# Patient Record
Sex: Female | Born: 1950 | Race: Black or African American | Hispanic: No | Smoking: Never smoker
Health system: Southern US, Community
[De-identification: ages and names within clinical notes are randomized; demographics above are authoritative.]

## PROBLEM LIST (undated history)

## (undated) DIAGNOSIS — M5126 Other intervertebral disc displacement, lumbar region: Secondary | ICD-10-CM

## (undated) DIAGNOSIS — M51369 Other intervertebral disc degeneration, lumbar region without mention of lumbar back pain or lower extremity pain: Secondary | ICD-10-CM

## (undated) DIAGNOSIS — E669 Obesity, unspecified: Secondary | ICD-10-CM

## (undated) DIAGNOSIS — M5136 Other intervertebral disc degeneration, lumbar region: Secondary | ICD-10-CM

## (undated) DIAGNOSIS — M48061 Spinal stenosis, lumbar region without neurogenic claudication: Secondary | ICD-10-CM

## (undated) DIAGNOSIS — I272 Pulmonary hypertension, unspecified: Secondary | ICD-10-CM

## (undated) DIAGNOSIS — K2970 Gastritis, unspecified, without bleeding: Secondary | ICD-10-CM

## (undated) DIAGNOSIS — K6289 Other specified diseases of anus and rectum: Secondary | ICD-10-CM

## (undated) DIAGNOSIS — M1990 Unspecified osteoarthritis, unspecified site: Secondary | ICD-10-CM

## (undated) DIAGNOSIS — I10 Essential (primary) hypertension: Secondary | ICD-10-CM

## (undated) DIAGNOSIS — K219 Gastro-esophageal reflux disease without esophagitis: Secondary | ICD-10-CM

## (undated) DIAGNOSIS — K439 Ventral hernia without obstruction or gangrene: Secondary | ICD-10-CM

## (undated) HISTORY — DX: Unspecified osteoarthritis, unspecified site: M19.90

## (undated) HISTORY — PX: ABDOMINAL HYSTERECTOMY: SHX81

## (undated) HISTORY — DX: Spinal stenosis, lumbar region without neurogenic claudication: M48.061

## (undated) HISTORY — DX: Essential (primary) hypertension: I10

## (undated) HISTORY — DX: Ventral hernia without obstruction or gangrene: K43.9

## (undated) HISTORY — DX: Pulmonary hypertension, unspecified: I27.20

## (undated) HISTORY — DX: Gastritis, unspecified, without bleeding: K29.70

## (undated) HISTORY — DX: Other specified diseases of anus and rectum: K62.89

## (undated) HISTORY — PX: CARPAL TUNNEL RELEASE: SHX101

## (undated) HISTORY — DX: Gastro-esophageal reflux disease without esophagitis: K21.9

## (undated) HISTORY — PX: WRIST SURGERY: SHX841

## (undated) HISTORY — DX: Obesity, unspecified: E66.9

## (undated) HISTORY — PX: TUBAL LIGATION: SHX77

## (undated) HISTORY — DX: Other intervertebral disc displacement, lumbar region: M51.26

## (undated) HISTORY — DX: Other intervertebral disc degeneration, lumbar region without mention of lumbar back pain or lower extremity pain: M51.369

## (undated) HISTORY — DX: Other intervertebral disc degeneration, lumbar region: M51.36

---

## 2000-02-18 ENCOUNTER — Encounter: Payer: Self-pay | Admitting: Family Medicine

## 2000-02-18 ENCOUNTER — Encounter: Admission: RE | Admit: 2000-02-18 | Discharge: 2000-02-18 | Payer: Self-pay | Admitting: Family Medicine

## 2000-04-08 ENCOUNTER — Encounter: Admission: RE | Admit: 2000-04-08 | Discharge: 2000-04-08 | Payer: Self-pay | Admitting: Obstetrics and Gynecology

## 2000-04-08 ENCOUNTER — Encounter: Payer: Self-pay | Admitting: Obstetrics and Gynecology

## 2001-10-10 ENCOUNTER — Encounter: Admission: RE | Admit: 2001-10-10 | Discharge: 2001-11-13 | Payer: Self-pay | Admitting: Family Medicine

## 2004-06-02 ENCOUNTER — Encounter: Payer: Self-pay | Admitting: Internal Medicine

## 2004-10-20 ENCOUNTER — Ambulatory Visit: Payer: Self-pay | Admitting: Family Medicine

## 2004-10-28 ENCOUNTER — Encounter: Admission: RE | Admit: 2004-10-28 | Discharge: 2004-10-28 | Payer: Self-pay | Admitting: Family Medicine

## 2005-05-03 ENCOUNTER — Ambulatory Visit: Payer: Self-pay | Admitting: Family Medicine

## 2005-07-02 ENCOUNTER — Ambulatory Visit: Payer: Self-pay | Admitting: Family Medicine

## 2005-07-23 ENCOUNTER — Ambulatory Visit: Payer: Self-pay | Admitting: Family Medicine

## 2005-09-27 ENCOUNTER — Ambulatory Visit: Payer: Self-pay | Admitting: Family Medicine

## 2005-12-20 ENCOUNTER — Other Ambulatory Visit: Admission: RE | Admit: 2005-12-20 | Discharge: 2005-12-20 | Payer: Self-pay | Admitting: Obstetrics and Gynecology

## 2006-02-11 ENCOUNTER — Encounter: Admission: RE | Admit: 2006-02-11 | Discharge: 2006-02-11 | Payer: Self-pay | Admitting: Family Medicine

## 2006-02-11 ENCOUNTER — Ambulatory Visit: Payer: Self-pay | Admitting: Family Medicine

## 2006-05-09 ENCOUNTER — Ambulatory Visit: Payer: Self-pay | Admitting: Family Medicine

## 2006-05-12 ENCOUNTER — Ambulatory Visit: Payer: Self-pay | Admitting: Family Medicine

## 2006-05-19 ENCOUNTER — Ambulatory Visit: Payer: Self-pay | Admitting: Family Medicine

## 2007-01-25 ENCOUNTER — Ambulatory Visit: Payer: Self-pay | Admitting: Family Medicine

## 2007-03-01 ENCOUNTER — Encounter: Admission: RE | Admit: 2007-03-01 | Discharge: 2007-03-01 | Payer: Self-pay | Admitting: Family Medicine

## 2007-05-25 ENCOUNTER — Ambulatory Visit: Payer: Self-pay | Admitting: Family Medicine

## 2007-05-25 DIAGNOSIS — B369 Superficial mycosis, unspecified: Secondary | ICD-10-CM | POA: Insufficient documentation

## 2007-05-25 DIAGNOSIS — I10 Essential (primary) hypertension: Secondary | ICD-10-CM | POA: Insufficient documentation

## 2007-05-25 LAB — CONVERTED CEMR LAB
ALT: 37 units/L — ABNORMAL HIGH (ref 0–35)
AST: 28 units/L (ref 0–37)
Basophils Relative: 0.5 % (ref 0.0–1.0)
Bilirubin, Direct: 0.1 mg/dL (ref 0.0–0.3)
CO2: 32 meq/L (ref 19–32)
Calcium: 9.2 mg/dL (ref 8.4–10.5)
Chloride: 103 meq/L (ref 96–112)
Eosinophils Relative: 2.4 % (ref 0.0–5.0)
GFR calc Af Amer: 67 mL/min
Glucose, Bld: 86 mg/dL (ref 70–99)
HCT: 43.3 % (ref 36.0–46.0)
Lymphocytes Relative: 36.5 % (ref 12.0–46.0)
Neutro Abs: 2.3 10*3/uL (ref 1.4–7.7)
Platelets: 272 10*3/uL (ref 150–400)
Total Protein: 7 g/dL (ref 6.0–8.3)
Triglycerides: 116 mg/dL (ref 0–149)
VLDL: 23 mg/dL (ref 0–40)
WBC: 4.2 10*3/uL — ABNORMAL LOW (ref 4.5–10.5)

## 2007-07-17 ENCOUNTER — Ambulatory Visit: Payer: Self-pay | Admitting: Family Medicine

## 2007-07-17 DIAGNOSIS — E669 Obesity, unspecified: Secondary | ICD-10-CM | POA: Insufficient documentation

## 2007-10-16 ENCOUNTER — Ambulatory Visit: Payer: Self-pay | Admitting: Family Medicine

## 2007-10-16 DIAGNOSIS — J029 Acute pharyngitis, unspecified: Secondary | ICD-10-CM | POA: Insufficient documentation

## 2008-03-04 ENCOUNTER — Encounter: Admission: RE | Admit: 2008-03-04 | Discharge: 2008-03-04 | Payer: Self-pay | Admitting: Family Medicine

## 2008-04-30 ENCOUNTER — Ambulatory Visit: Payer: Self-pay | Admitting: Family Medicine

## 2008-04-30 DIAGNOSIS — K219 Gastro-esophageal reflux disease without esophagitis: Secondary | ICD-10-CM | POA: Insufficient documentation

## 2008-06-11 ENCOUNTER — Telehealth: Payer: Self-pay | Admitting: Internal Medicine

## 2008-06-11 ENCOUNTER — Ambulatory Visit: Payer: Self-pay | Admitting: Family Medicine

## 2008-06-12 ENCOUNTER — Ambulatory Visit: Payer: Self-pay | Admitting: Internal Medicine

## 2008-06-12 DIAGNOSIS — R12 Heartburn: Secondary | ICD-10-CM | POA: Insufficient documentation

## 2008-06-12 DIAGNOSIS — R079 Chest pain, unspecified: Secondary | ICD-10-CM | POA: Insufficient documentation

## 2008-06-26 ENCOUNTER — Ambulatory Visit: Payer: Self-pay | Admitting: Internal Medicine

## 2008-07-03 ENCOUNTER — Encounter: Payer: Self-pay | Admitting: Internal Medicine

## 2008-07-03 ENCOUNTER — Ambulatory Visit: Payer: Self-pay | Admitting: Internal Medicine

## 2008-07-04 ENCOUNTER — Encounter: Payer: Self-pay | Admitting: Internal Medicine

## 2008-07-15 ENCOUNTER — Ambulatory Visit: Payer: Self-pay | Admitting: Family Medicine

## 2008-07-15 LAB — CONVERTED CEMR LAB
AST: 24 units/L (ref 0–37)
Albumin: 3.8 g/dL (ref 3.5–5.2)
Alkaline Phosphatase: 38 units/L — ABNORMAL LOW (ref 39–117)
BUN: 22 mg/dL (ref 6–23)
Bilirubin, Direct: 0.1 mg/dL (ref 0.0–0.3)
Blood in Urine, dipstick: NEGATIVE
Chloride: 107 meq/L (ref 96–112)
Eosinophils Absolute: 0.1 10*3/uL (ref 0.0–0.7)
Eosinophils Relative: 2.6 % (ref 0.0–5.0)
GFR calc non Af Amer: 50 mL/min
HDL: 28.1 mg/dL — ABNORMAL LOW (ref >39.0)
MCV: 88 fL (ref 78.0–100.0)
Monocytes Relative: 6.3 % (ref 3.0–12.0)
Neutrophils Relative %: 49.5 % (ref 43.0–77.0)
Nitrite: NEGATIVE
Platelets: 198 10*3/uL (ref 150–400)
Potassium: 3.4 meq/L — ABNORMAL LOW (ref 3.5–5.1)
Protein, U semiquant: NEGATIVE
Sodium: 146 meq/L — ABNORMAL HIGH (ref 135–145)
Total CHOL/HDL Ratio: 5.4
Urobilinogen, UA: 1
VLDL: 22 mg/dL (ref 0–40)
WBC Urine, dipstick: NEGATIVE
WBC: 4.2 10*3/uL — ABNORMAL LOW (ref 4.5–10.5)

## 2008-08-08 ENCOUNTER — Ambulatory Visit (HOSPITAL_BASED_OUTPATIENT_CLINIC_OR_DEPARTMENT_OTHER): Admission: RE | Admit: 2008-08-08 | Discharge: 2008-08-08 | Payer: Self-pay | Admitting: Orthopedic Surgery

## 2009-05-22 ENCOUNTER — Ambulatory Visit: Payer: Self-pay | Admitting: Family Medicine

## 2009-06-25 ENCOUNTER — Ambulatory Visit: Payer: Self-pay | Admitting: Internal Medicine

## 2009-07-30 ENCOUNTER — Encounter: Admission: RE | Admit: 2009-07-30 | Discharge: 2009-07-30 | Payer: Self-pay | Admitting: Family Medicine

## 2009-11-10 ENCOUNTER — Ambulatory Visit: Payer: Self-pay | Admitting: Internal Medicine

## 2010-05-29 ENCOUNTER — Ambulatory Visit: Payer: Self-pay | Admitting: Internal Medicine

## 2010-05-29 DIAGNOSIS — R1013 Epigastric pain: Secondary | ICD-10-CM

## 2010-05-29 DIAGNOSIS — K3189 Other diseases of stomach and duodenum: Secondary | ICD-10-CM | POA: Insufficient documentation

## 2010-05-29 DIAGNOSIS — K439 Ventral hernia without obstruction or gangrene: Secondary | ICD-10-CM | POA: Insufficient documentation

## 2010-06-02 ENCOUNTER — Ambulatory Visit (HOSPITAL_COMMUNITY)
Admission: RE | Admit: 2010-06-02 | Discharge: 2010-06-02 | Payer: Self-pay | Source: Home / Self Care | Admitting: Internal Medicine

## 2010-08-10 ENCOUNTER — Ambulatory Visit: Payer: Self-pay | Admitting: Family Medicine

## 2010-08-10 DIAGNOSIS — M898X9 Other specified disorders of bone, unspecified site: Secondary | ICD-10-CM | POA: Insufficient documentation

## 2010-08-11 ENCOUNTER — Encounter: Admission: RE | Admit: 2010-08-11 | Discharge: 2010-08-11 | Payer: Self-pay | Admitting: Family Medicine

## 2010-10-01 ENCOUNTER — Other Ambulatory Visit: Payer: Self-pay | Admitting: Family Medicine

## 2010-10-01 ENCOUNTER — Ambulatory Visit
Admission: RE | Admit: 2010-10-01 | Discharge: 2010-10-01 | Payer: Self-pay | Source: Home / Self Care | Attending: Family Medicine | Admitting: Family Medicine

## 2010-10-01 LAB — CBC WITH DIFFERENTIAL/PLATELET
Basophils Absolute: 0 10*3/uL (ref 0.0–0.1)
Basophils Relative: 0.4 % (ref 0.0–3.0)
Eosinophils Absolute: 0.1 10*3/uL (ref 0.0–0.7)
Eosinophils Relative: 2.6 % (ref 0.0–5.0)
HCT: 41 % (ref 36.0–46.0)
Hemoglobin: 14.2 g/dL (ref 12.0–15.0)
Lymphocytes Relative: 32.2 % (ref 12.0–46.0)
Lymphs Abs: 1.3 10*3/uL (ref 0.7–4.0)
MCHC: 34.6 g/dL (ref 30.0–36.0)
MCV: 87.7 fl (ref 78.0–100.0)
Monocytes Absolute: 0.3 10*3/uL (ref 0.1–1.0)
Monocytes Relative: 7.7 % (ref 3.0–12.0)
Neutro Abs: 2.2 10*3/uL (ref 1.4–7.7)
Neutrophils Relative %: 57.1 % (ref 43.0–77.0)
Platelets: 191 10*3/uL (ref 150.0–400.0)
RBC: 4.68 Mil/uL (ref 3.87–5.11)
RDW: 13.7 % (ref 11.5–14.6)
WBC: 3.9 10*3/uL — ABNORMAL LOW (ref 4.5–10.5)

## 2010-10-01 LAB — BASIC METABOLIC PANEL
BUN: 18 mg/dL (ref 6–23)
CO2: 25 mEq/L (ref 19–32)
Calcium: 8.3 mg/dL — ABNORMAL LOW (ref 8.4–10.5)
Chloride: 103 mEq/L (ref 96–112)
Creatinine, Ser: 1 mg/dL (ref 0.4–1.2)
GFR: 77.75 mL/min (ref >60.00)
Glucose, Bld: 91 mg/dL (ref 70–99)
Potassium: 3.9 mEq/L (ref 3.5–5.1)
Sodium: 135 mEq/L (ref 135–145)

## 2010-10-01 LAB — LIPID PANEL
Cholesterol: 136 mg/dL (ref 0–200)
HDL: 30 mg/dL — ABNORMAL LOW (ref >39.00)
LDL Cholesterol: 91 mg/dL (ref 0–99)
Total CHOL/HDL Ratio: 5
Triglycerides: 73 mg/dL (ref 0.0–149.0)
VLDL: 14.6 mg/dL (ref 0.0–40.0)

## 2010-10-01 LAB — HEPATIC FUNCTION PANEL
ALT: 24 U/L (ref 0–35)
AST: 21 U/L (ref 0–37)
Albumin: 3.7 g/dL (ref 3.5–5.2)
Alkaline Phosphatase: 57 U/L (ref 39–117)
Bilirubin, Direct: 0.1 mg/dL (ref 0.0–0.3)
Total Bilirubin: 0.9 mg/dL (ref 0.3–1.2)
Total Protein: 6.3 g/dL (ref 6.0–8.3)

## 2010-10-01 LAB — CONVERTED CEMR LAB
Blood in Urine, dipstick: NEGATIVE
Ketones, urine, test strip: NEGATIVE
Nitrite: NEGATIVE
Protein, U semiquant: NEGATIVE
Urobilinogen, UA: 0.2

## 2010-10-01 LAB — TSH: TSH: 1.63 u[IU]/mL (ref 0.35–5.50)

## 2010-10-07 ENCOUNTER — Encounter: Payer: Self-pay | Admitting: Family Medicine

## 2010-10-07 ENCOUNTER — Ambulatory Visit
Admission: RE | Admit: 2010-10-07 | Discharge: 2010-10-07 | Payer: Self-pay | Source: Home / Self Care | Attending: Family Medicine | Admitting: Family Medicine

## 2010-10-20 NOTE — Assessment & Plan Note (Signed)
Summary: acid reflux--ch.   History of Present Illness Primary GI MD: Lina Sar MD Primary Provider: Kelle Darting, MD Requesting Provider: n/a Chief Complaint: Increasing acid reflux symptoms and esophageal pain with some upper abd discomfort. Pt is on Nexium qd.  History of Present Illness:   This is a 60 year old African American female with gastroesophageal reflux diagnosed in October 2009 on an upper endoscopy which showed a gastric inlet patch in the proximal esophagus. There was no evidence of Barrett's esophagus and her H. pylori was negative. She comes with recurrent symptoms of gastroesophageal reflux manifested by recurrent heartburn. She had an episode of epigastric swelling and contractions which felt like jumping. She denies dysphagia. She has been on Nexium 40 mg every morning since her last appointment in February 2011. She takes occasional Aleve and drinks 2-3 cans of soda a day. She does not smoke. She has been overweight. A colonoscopy in 2005 was normal.   GI Review of Systems    Reports abdominal pain, acid reflux, belching, and  bloating.     Location of  Abdominal pain: upper abdomen.    Denies chest pain, dysphagia with liquids, dysphagia with solids, heartburn, loss of appetite, nausea, vomiting, vomiting blood, weight loss, and  weight gain.        Denies anal fissure, black tarry stools, change in bowel habit, constipation, diarrhea, diverticulosis, fecal incontinence, heme positive stool, hemorrhoids, irritable bowel syndrome, jaundice, light color stool, liver problems, rectal bleeding, and  rectal pain.    Current Medications (verified): 1)  Tenormin 50 Mg Tabs (Atenolol) .... Take 1 Tablet By Mouth Every Morning 2)  Nexium 40 Mg Cpdr (Esomeprazole Magnesium) .... Take 1 Tablet By Mouth Once A Day  Allergies (verified): No Known Drug Allergies  Past History:  Past Medical History: Reviewed history from 06/19/2008 and no changes required. degenerative  joint disease Arthritis HEARTBURN (ICD-787.1) CHEST PAIN (ICD-786.50) GERD (ICD-530.81) SORE THROAT (ICD-462) OBESITY NOS (ICD-278.00) HEALTH SCREENING (ICD-V70.0) DERMATOMYCOSIS NOS (ICD-111.9) HYPERTENSION (ICD-401.9)  Past Surgical History: Reviewed history from 06/19/2008 and no changes required. Hysterectomy Bilateral Tubal Ligation Cyst removal from right wrist  Family History: Reviewed history from 06/12/2008 and no changes required. Family History of Breast Cancer:cousin Family History of Colon Cancer:uncle Family History of Colon Polyps:Uncle Family History of Diabetes: Aunt Family History of Heart Disease: Mother Family History of Kidney Disease:Uncle  Social History: Reviewed history from 07/17/2007 and no changes required. 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  Review of Systems  The patient denies allergy/sinus, anemia, anxiety-new, arthritis/joint pain, back pain, blood in urine, breast changes/lumps, change in vision, confusion, cough, coughing up blood, depression-new, fainting, fatigue, fever, headaches-new, hearing problems, heart murmur, heart rhythm changes, itching, menstrual pain, muscle pains/cramps, night sweats, nosebleeds, pregnancy symptoms, shortness of breath, skin rash, sleeping problems, sore throat, swelling of feet/legs, swollen lymph glands, thirst - excessive , urination - excessive , urination changes/pain, urine leakage, vision changes, and voice change.         Pertinent positive and negative review of systems were noted in the above HPI. All other ROS was otherwise negative.   Vital Signs:  Patient profile:   60 year old female Height:      72 inches Weight:      299 pounds BMI:     40.70 BSA:     2.53 Pulse rate:   70 / minute Pulse rhythm:   regular BP sitting:   118 /  68  (right arm) Cuff size:   large  Vitals Entered By: Christie Nottingham CMA Duncan Dull)  (May 29, 2010 8:12 AM)  Physical Exam  General:  alert, oriented and in no distress, her voice was normal. Eyes:  nonicteric. Mouth:  no thrush. Neck:  Supple; no masses or thyromegaly. Lungs:  Clear throughout to auscultation. Heart:  Regular rate and rhythm; no murmurs, rubs,  or bruits. Abdomen:  soft obese abdomen with minimal tenderness in the epigastrium in the subxiphoid area. Ventral hernia due to rectus diathesis easily reducible. Liver edge at costal margin, lower abdomen unremarkable. Extremities:  No clubbing, cyanosis, edema or deformities noted. Skin:  Intact without significant lesions or rashes. Psych:  Alert and cooperative. Normal mood and affect.   Impression & Recommendations:  Problem # 1:  GERD (ICD-530.81) Patient has refractory gastroesophageal reflux. We will increase her Nexium to 40 mg twice a day. If not approved by her insurance, she will substitute over-the-counter Prilosec 20 mg in the evening while continuing her Nexium 40 mg in the morning. She should reduce her caffeine intake and attempt weight loss.  Problem # 2:  VENTRAL HERNIA (ICD-553.20) Patient has a small ventral hernia causing intermittent bulging in the upper abdomen. She does not need surgical intervention at this time. I explained to the patient what is happening. We will proceed with an upper abdominal ultrasound to rule out gallbladder disease as part of the evaluation of her dyspepsia.  Other Orders: Ultrasound Abdomen (UAS)  Patient Instructions: 1)  increase Nexium to 40 mg p.o. b.i.d. for 2 weeks then decrease to one a day. 2)  As an alternative, add Prilosec 20 mg at bedtime while continuing Nexium 40 mg in a.m. 3)  If symptoms persist, consider a repeat upper endoscopy. 4)  Upper abdominal ultrasound. 5)  Recall colonoscopy 2015. 6)  Copy sent to : Dr Shela Commons.Todd 7)  The medication list was reviewed and reconciled.  All changed / newly prescribed medications were explained.  A  complete medication list was provided to the patient / caregiver. Prescriptions: NEXIUM 40 MG CPDR (ESOMEPRAZOLE MAGNESIUM) Take 1 tablet by mouth two times a day  #60 x 2   Entered by:   Lamona Curl CMA (AAMA)   Authorized by:   Hart Carwin MD   Signed by:   Lamona Curl CMA (AAMA) on 05/29/2010   Method used:   Electronically to        [facility]  [address] # [id number]* (retail)       [address]       [place], [place]  [address]       Ph: [phone] or [phone]       Fax: [phone]   RxID:   [id number]

## 2010-10-20 NOTE — Assessment & Plan Note (Signed)
Summary: knot on foot//njr   Vital Signs:  Patient profile:   60 year old female Weight:      300 pounds Temp:     98.5 degrees F oral BP sitting:   140 / 90  (left arm) Cuff size:   regular  Vitals Entered By: Kern Reap CMA Duncan Dull) (August 10, 2010 2:49 PM) CC: knot on left foot   Primary Care Provider:  Kelle Darting, MD  CC:  knot on left foot.  History of Present Illness: Kiara Werner  is a 60 year old female, who comes in today for evaluation of a knot on the dorsal part of her left foot.  States about a week or 10 days ago she noticed a knot on the dorsum of her left foot.  No history of trauma  Allergies: No Known Drug Allergies  Past History:  Past medical, surgical, family and social histories (including risk factors) reviewed for relevance to current acute and chronic problems.  Past Medical History: Reviewed history from 06/19/2008 and no changes required. degenerative joint disease Arthritis HEARTBURN (ICD-787.1) CHEST PAIN (ICD-786.50) GERD (ICD-530.81) SORE THROAT (ICD-462) OBESITY NOS (ICD-278.00) HEALTH SCREENING (ICD-V70.0) DERMATOMYCOSIS NOS (ICD-111.9) HYPERTENSION (ICD-401.9)  Past Surgical History: Reviewed history from 06/19/2008 and no changes required. Hysterectomy Bilateral Tubal Ligation Cyst removal from right wrist  Family History: Reviewed history from 06/12/2008 and no changes required. Family History of Breast Cancer:cousin Family History of Colon Cancer:uncle Family History of Colon Polyps:Uncle Family History of Diabetes: Aunt Family History of Heart Disease: Mother Family History of Kidney Disease:Uncle  Social History: Reviewed history from 07/17/2007 and no changes required. 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 Take 1 tablet by mouth every morning 2)  Nexium 40 Mg Cpdr (Esomeprazole magnesium) .... Take 1 tablet by mouth two times a day  Patient Instructions: 1)  call [facility].......... asked to see Dr. Toni Arthurs, the orthopedist, who specializes in feet . 2)  Return in January for your annual physical exam........Marland Kitchen401.9 3)  BMP prior to visit, ICD-9: 4)  Hepatic Panel prior to visit, ICD-9: 5)  Lipid Panel prior to visit, ICD-9: 6)  TSH prior to visit, ICD-9: 7)  CBC w/ Diff prior to visit, ICD-9: 8)  Urine-dip prior to visit, ICD-9:   Orders Added: 1)  Est. Patient Level III [[address]]

## 2010-10-20 NOTE — Assessment & Plan Note (Signed)
Summary: acid reflux/chest pain...as.   History of Present Illness Visit Type: Follow-up Visit Primary GI MD: Lina Sar MD Primary Provider: Kelle Darting, MD Requesting Provider: Kelle Darting, MD Chief Complaint: acid reflux , phlegm in throat, and nausea with certain foods History of Present Illness:   This is a 60 year old African American female with chronic gastroesophageal reflux and intermittent chest pain. An upper endoscopy in October 2009 showed a small esophageal inlet patch but there was no evidence esophagitis. Her CLOtest was negative. Her last colonoscopy in 2005 was normal and her recall colonoscopy is scheduled for September 2015. She takes occasional TUMS or Rolaids but no acid reducers. She denies dysphagia, nocturnal cough or dysphagia. Patient does not smoke and does not use any NSAIDs.   GI Review of Systems    Reports acid reflux, belching, and  nausea.      Denies abdominal pain, chest pain, dysphagia with liquids, dysphagia with solids, heartburn, loss of appetite, vomiting, vomiting blood, weight loss, and  weight gain.      Reports constipation.     Denies anal fissure, black tarry stools, change in bowel habit, diarrhea, diverticulosis, fecal incontinence, heme positive stool, hemorrhoids, irritable bowel syndrome, jaundice, light color stool, liver problems, rectal bleeding, and  rectal pain.    Current Medications (verified): 1)  Tenormin 50 Mg Tabs (Atenolol) .... Take 1 Tablet By Mouth Every Morning 2)  Cipro 250 Mg Tabs (Ciprofloxacin Hcl) .Marland Kitchen.. 1 Tablet By Mouth Two Times A Day  Allergies (verified): No Known Drug Allergies  Past History:  Past Medical History: Reviewed history from 06/19/2008 and no changes required. degenerative joint disease Arthritis HEARTBURN (ICD-787.1) CHEST PAIN (ICD-786.50) GERD (ICD-530.81) SORE THROAT (ICD-462) OBESITY NOS (ICD-278.00) HEALTH SCREENING (ICD-V70.0) DERMATOMYCOSIS NOS (ICD-111.9) HYPERTENSION  (ICD-401.9)  Past Surgical History: Reviewed history from 06/19/2008 and no changes required. Hysterectomy Bilateral Tubal Ligation Cyst removal from right wrist  Family History: Reviewed history from 06/12/2008 and no changes required. Family History of Breast Cancer:cousin Family History of Colon Cancer:uncle Family History of Colon Polyps:Uncle Family History of Diabetes: Aunt Family History of Heart Disease: Mother Family History of Kidney Disease:Uncle  Social History: Reviewed history from 07/17/2007 and no changes required. 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  The patient denies allergy/sinus, anemia, anxiety-new, arthritis/joint pain, back pain, blood in urine, breast changes/lumps, change in vision, confusion, cough, coughing up blood, depression-new, fainting, fatigue, fever, headaches-new, hearing problems, heart murmur, heart rhythm changes, itching, menstrual pain, muscle pains/cramps, night sweats, nosebleeds, pregnancy symptoms, shortness of breath, skin rash, sleeping problems, sore throat, swelling of feet/legs, swollen lymph glands, thirst - excessive , urination - excessive , urine leakage, vision changes, and voice change.         Pertinent positive and negative review of systems were noted in the above HPI. All other ROS was otherwise negative.   Vital Signs:  Patient profile:   60 year old female Height:      72 inches Weight:      296 pounds BMI:     40.29 Pulse rate:   68 / minute Pulse rhythm:   regular BP sitting:   124 / 74  (left arm)  Vitals Entered By: Milford Cage NCMA (November 10, 2009 3:49 PM)  Physical Exam  General:  overweight, alert and oriented. Mouth:  normal boys Neck:  Supple; no  masses or thyromegaly. Lungs:  Clear throughout to auscultation. Heart:  Regular rate and rhythm; no  murmurs, rubs,  or bruits. Abdomen:  Soft, nontender and nondistended. No masses, hepatosplenomegaly or hernias noted. Normal bowel sounds. Skin:  Intact without significant lesions or rashes. Psych:  Alert and cooperative. Normal mood and affect.   Impression & Recommendations:  Problem # 1:  CHEST PAIN (ICD-786.50) Patient has known gastroesophageal reflux. A recent upper endoscopy did not show any evidence of Barrett's esophagus. We will start patient on a proton pump inhibitor; Nexium 40 mg a day. In the meantime, we have given her samples of AcipHex since he did not have any samples of Nexium in our office. She will have to continue antireflux measures.  Problem # 2:  OBESITY NOS (ICD-278.00) I suggested weight reduction to improve her gastroesophageal reflux disease.  Problem # 3:  SPECIAL SCREENING FOR MALIGNANT NEOPLASMS COLON (ICD-V76.51) status post screening colonoscopy in 2005. A recall colonoscopy will be due in September 2015.  Patient Instructions: 1)  Nexium 40 mg daily. 2)  Antireflux measures. 3)  Weight loss. 4)  Recall colonoscopy September 2015. 5)  Copy sent to : Dr Shela Commons.Todd 6)  The medication list was reviewed and reconciled.  All changed / newly prescribed medications were explained.  A complete medication list was provided to the patient / caregiver. Prescriptions: NEXIUM 40 MG CPDR (ESOMEPRAZOLE MAGNESIUM) Take 1 tablet by mouth once a day  #30 x 5   Entered by:   Hortense Ramal CMA (AAMA)   Authorized by:   Hart Carwin MD   Signed by:   Hortense Ramal CMA (AAMA) on 11/10/2009   Method used:   Electronically to        [facility] # [id number]* (retail)       [address]       [place], [place]  [address]       Ph: [phone] or [phone]       Fax: [phone]   RxID:   [id number]

## 2010-10-22 NOTE — Assessment & Plan Note (Signed)
Summary: cpx/pap/njr   Vital Signs:  Patient profile:   60 year old female Menstrual status:  hysterectomy Height:      72 inches Weight:      300 pounds Temp:     98.3 degrees F oral BP sitting:   118 / 80  (left arm) Cuff size:   regular  Vitals Entered By: Kern Reap CMA Duncan Dull) (October 07, 2010 9:15 AM) CC: cpx     Menstrual Status hysterectomy   Primary Care Provider:  Kelle Darting, MD  CC:  cpx.  History of Present Illness: Kiara Werner is a 60 year old single female, nonsmoker, who comes in today for general medical examination because of a history of underlying hypertension, reflux, esophagitis, and obesity.  She is on Tenormin 50 mg daily BP 118/80.  She takes Nexium 40 mg daily, sometimes b.i.d. for reflux esophagitis.  She's tried the over-the-counter omeprazole, but it does not work.  She was instructed by GI to take the Nexium daily.  Her weight is 300 pounds.  She, states she's walking an hour 3 times a week and has lost 3 pounds since Christmas.  She gets routine eye care, dental care, BSE monthly, and you mammography, colonoscopy, normal 2005, tetanus, 2000, seasonal flu 2011  Allergies (verified): No Known Drug Allergies  Past History:  Past medical, surgical, family and social histories (including risk factors) reviewed, and no changes noted (except as noted below).  Past Medical History: Reviewed history from 06/19/2008 and no changes required. degenerative joint disease Arthritis HEARTBURN (ICD-787.1) CHEST PAIN (ICD-786.50) GERD (ICD-530.81) SORE THROAT (ICD-462) OBESITY NOS (ICD-278.00) HEALTH SCREENING (ICD-V70.0) DERMATOMYCOSIS NOS (ICD-111.9) HYPERTENSION (ICD-401.9)  Past Surgical History: Reviewed history from 06/19/2008 and no changes required. Hysterectomy Bilateral Tubal Ligation Cyst removal from right wrist  Family History: Reviewed history from 06/12/2008 and no changes required. Family History of Breast  Cancer:cousin Family History of Colon Cancer:uncle Family History of Colon Polyps:Uncle Family History of Diabetes: Aunt Family History of Heart Disease: Mother Family History of Kidney Disease:Uncle  Social History: Reviewed history from 07/17/2007 and no changes required. 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 No apparent alopecia or balding. Eyes:  No corneal or conjunctival inflammation noted. EOMI. Perrla. Funduscopic exam benign, without hemorrhages, exudates or papilledema. Vision grossly normal. Ears:  External ear exam shows no significant lesions or deformities.  Otoscopic examination reveals clear canals, tympanic membranes are intact bilaterally without bulging, retraction, inflammation or discharge. Hearing is grossly normal bilaterally. Nose:  External nasal examination shows no deformity or inflammation. Nasal mucosa are pink and moist without lesions or exudates. Mouth:  Oral mucosa and oropharynx without lesions or exudates.  Teeth in good repair. Neck:  No deformities, masses, or tenderness noted. Chest Wall:  No deformities, masses, or tenderness noted. Breasts:  No mass, nodules, thickening, tenderness, bulging, retraction, inflamation, nipple discharge or skin changes noted.   Lungs:  Normal respiratory effort, chest expands symmetrically. Lungs are clear to auscultation, no crackles or wheezes. Heart:  Normal rate and regular rhythm. S1 and S2 normal without gallop, murmur, click, rub or other extra sounds. Abdomen:  Bowel sounds positive,abdomen soft and non-tender without masses, organomegaly or hernias noted. Rectal:  No external abnormalities noted. Normal  sphincter tone. No rectal masses or  tenderness. Genitalia:  Pelvic Exam:        External: normal female genitalia without lesions or masses        Vagina: normal without lesions or masses        Cervix: normal without lesions or masses        Adnexa: normal bimanual exam without masses or fullness        Uterus: normal by palpation        Pap smear: not performed Msk:  No deformity or scoliosis noted of thoracic or lumbar spine.   Pulses:  R and L carotid,radial,femoral,dorsalis pedis and posterior tibial pulses are full and equal bilaterally Extremities:  No clubbing, cyanosis, edema, or deformity noted with normal full range of motion of all joints.   Neurologic:  No cranial nerve deficits noted. Station and gait are normal. Plantar reflexes are down-going bilaterally. DTRs are symmetrical throughout. Sensory, motor and coordinative functions appear intact. Skin:  Intact without suspicious lesions or rashes Cervical Nodes:  No lymphadenopathy noted Axillary Nodes:  No palpable lymphadenopathy Inguinal Nodes:  No significant adenopathy Psych:  Cognition and judgment appear intact. Alert and cooperative with normal attention span and concentration. No apparent delusions, illusions, hallucinations   Impression & Recommendations:  Problem # 1:  GERD (ICD-530.81) Assessment Comment Only  Her updated medication list for this problem includes:    Nexium 40 Mg Cpdr (Esomeprazole magnesium) .Marland Kitchen... Take 1 tablet by mouth two times a day  Orders: Prescription Created Electronically [phone]) EKG w/ Interpretation ([address])  Problem # 2:  OBESITY NOS (ICD-278.00) Assessment: Unchanged  Orders: Prescription Created Electronically [phone]) EKG w/ Interpretation ([address])  Problem # 3:  HYPERTENSION (ICD-401.9) Assessment: Improved  Her updated medication list for this problem includes:    Tenormin 50 Mg Tabs (Atenolol) .Marland Kitchen... Take 1 tablet by mouth every morning  Orders: Prescription Created  Electronically [phone]) EKG w/ Interpretation ([address])  Problem # 4:  HEALTH SCREENING (ICD-V70.0) Assessment: Unchanged  Orders: Prescription Created Electronically [phone])  Complete Medication List: 1)  Tenormin 50 Mg Tabs (Atenolol) .... Take 1 tablet by mouth every morning 2)  Nexium 40 Mg Cpdr (Esomeprazole magnesium) .... Take 1 tablet by mouth two times a day  Patient Instructions: 1)  continue your walking program.  Also, on weekends, I would begin a walking program.  Also 2)  During to 30 ounces of water daily. 3)   decrease y  caloric intake to around 2000 calories per day 4)  Please schedule a follow-up appointment in 1 year. 5)  Take calcium +Vitamin D daily. 6)  Take an Aspirin every day. Prescriptions: NEXIUM 40 MG CPDR (ESOMEPRAZOLE MAGNESIUM) Take 1 tablet by mouth two times a day  #100 x 4   Entered and Authorized by:   Roderick Pee MD   Signed by:   Roderick Pee MD on 10/07/2010   Method used:   Electronically to        [facility]  [address] # [id number]* (retail)       [address]       [place], [place]  [address]       Ph: [phone] or [phone]       Fax: [phone]   RxID:   [id number] TENORMIN 50 MG TABS (ATENOLOL) Take 1 tablet by mouth every morning  #100 x 3   Entered and Authorized by:   Roderick Pee MD   Signed by:   Tinnie Gens  Shawnie Dapper MD on 10/07/2010   Method used:   Electronically to        [facility] # [id number]* (retail)       [address]       [place], [place]  [address]       Ph: [phone] or [phone]       Fax: [phone]   RxID:   [id number]    Orders Added: 1)  Prescription Created Electronically [G8553] 2)  Est. Patient 40-64 years [99[address]] 3)  EKG w/ Interpretation [93000]   Immunization History:  Influenza Immunization History:    Influenza:  historical (06/20/2010)   Immunization History:  Influenza Immunization History:    Influenza:  Historical  (06/20/2010)

## 2011-02-02 NOTE — Op Note (Signed)
NAME:  CYBILL, URIEGAS          ACCOUNT NO.:  [id number]   MEDICAL RECORD NO.:  [id number]          PATIENT TYPE:  AMB   LOCATION:  DSC                          FACILITY:  [facility]   PHYSICIAN:  Katy Fitch. Sypher, M.D. 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   OPERATIONS:  Release of right first dorsal compartment followed by  synovectomy of extensor pollicis brevis and two slips of abductor  pollicis longus tendons and resection of septum between abductor  pollicis longus and extensor pollicis brevis tendons.   OPERATING SURGEON:  Katy Fitch. Sypher, MD   ASSISTANT:  Marveen Reeks Dasnoit, PA-C   ANESTHESIA:  General by LMA.   SUPERVISING ANESTHESIOLOGIST:  Zenon Mayo, MD   INDICATIONS:  Kiara Werner is a 60 year old woman referred through  the courtesy of Dr. Alonza Smoker for evaluation and management of a painful  right wrist.  Clinical examination revealed signs of first dorsal  compartment stenosing tenosynovitis.  She had marked swelling, pain, and  deformity.   Due to failure to respond to nonoperative measures, she is brought to  the operating at this time anticipating release of the first dorsal  compartment probable synovectomy of the extensor pollicis brevis and  abductor pollicis longus tendons and septum resection if one is  identified.  Preoperatively, she was advised the risks and benefits of  surgery.  Questions were invited and answered in detail.   PROCEDURE:  [name] is brought to the operating room and  placed in supine position upon the operating table.   Following the induction of general anesthesia by LMA  technique, the  right arm was prepped with Betadine soap solution and sterilely draped.  A pneumatic tourniquet was applied to the proximal right brachium.   Following exsanguination of the right arm with the Esmarch bandage, the  arterial tourniquet was inflated to 250 mmHg.  The procedure commenced  with a short transverse incision directly over the palpably thickened  first dorsal compartment.  Subcutaneous tissues were carefully divided  revealing quite a bit of peri-retinacular fibrosis.  The radial  superficial sensory branch was gently elevated off of the first dorsal  compartment with a Freer followed by placement of the blunt retractors.  The compartment was split over its dorsal aspect and immediately a  septum between the abductor pollicis longus and extensor pollicis brevis  tendon was identified.  The septum was excised with scissors and rongeur  dissection followed by retraction of a single slip of the extensor  pollicis brevis and exposure of the abductor pollicis longus tendon  slips.  All three tendons were invested in fibrotic tenosynovium deep to  and proximal to the first dorsal compartment.  This was quite  hypertrophic and could cause adhesions.  Therefore, a formal synovectomy  of the abductor pollicis longus tendon slips was completed with scissors  and rongeur dissection and a formal synovectomy of the extensor pollicis  brevis was completed.  Thereafter, free range of motion of the thumb and  wrist was recovered.   The wound was then repaired with intradermal 3-0 Prolene and Steri-  Strips.  Lidocaine 2% was infiltrated for postoperative analgesia.  A  compressive dressing was applied with sterile gauze, Tegaderm, and an  Ace bandage.   Ms. Helser return for followup in 9 days for suture removal.      Katy Fitch. Sypher, M.D.  Electronically Signed     RVS/MEDQ  D:  08/08/2008  T:  08/08/2008  Job:  [id number]

## 2011-03-18 ENCOUNTER — Telehealth: Payer: Self-pay | Admitting: Internal Medicine

## 2011-03-18 NOTE — Telephone Encounter (Signed)
Pt is c/o abdominal pain and constipation. Pt requests an appt with Dr. Juanda Chance. Pt scheduled to see Dr. Juanda Chance 03/30/11@2pm . Pt aware of appt date and time.

## 2011-03-30 ENCOUNTER — Ambulatory Visit (INDEPENDENT_AMBULATORY_CARE_PROVIDER_SITE_OTHER): Payer: BC Managed Care – PPO | Admitting: Internal Medicine

## 2011-03-30 ENCOUNTER — Encounter: Payer: Self-pay | Admitting: Internal Medicine

## 2011-03-30 ENCOUNTER — Other Ambulatory Visit (INDEPENDENT_AMBULATORY_CARE_PROVIDER_SITE_OTHER): Payer: BC Managed Care – PPO

## 2011-03-30 VITALS — BP 122/76 | HR 74 | Ht 74.0 in | Wt 298.4 lb

## 2011-03-30 DIAGNOSIS — R195 Other fecal abnormalities: Secondary | ICD-10-CM

## 2011-03-30 DIAGNOSIS — R109 Unspecified abdominal pain: Secondary | ICD-10-CM

## 2011-03-30 LAB — CBC WITH DIFFERENTIAL/PLATELET
Basophils Relative: 0.5 % (ref 0.0–3.0)
Eosinophils Absolute: 0.1 10*3/uL (ref 0.0–0.7)
Eosinophils Relative: 2.1 % (ref 0.0–5.0)
HCT: 42.4 % (ref 36.0–46.0)
Hemoglobin: 14.7 g/dL (ref 12.0–15.0)
Lymphs Abs: 1.7 10*3/uL (ref 0.7–4.0)
MCHC: 34.6 g/dL (ref 30.0–36.0)
MCV: 88.2 fl (ref 78.0–100.0)
Monocytes Absolute: 0.3 10*3/uL (ref 0.1–1.0)
Neutro Abs: 2.5 10*3/uL (ref 1.4–7.7)
RBC: 4.81 Mil/uL (ref 3.87–5.11)
WBC: 4.7 10*3/uL (ref 4.5–10.5)

## 2011-03-30 MED ORDER — PEG-KCL-NACL-NASULF-NA ASC-C 100 G PO SOLR: 1.0000 | Freq: Once | ORAL | Status: DC

## 2011-03-30 MED ORDER — POLYETHYLENE GLYCOL 3350 17 GM/SCOOP PO POWD: ORAL | Status: DC

## 2011-03-30 NOTE — Patient Instructions (Addendum)
You have been scheduled for a colonoscopy. Please follow written instructions given to you at your visit today.  Please pick up your Moviprep kit at the pharmacy within the next 2-3 days. Your physician has requested that you go to the basement for the following lab work before leaving today: CBC, IBC panel, B12 level We have sent the following medications to your pharmacy for you to pick up at your convenience: Miralax. Take 1 capful dissolved in at least 8 ounces of water or juice once daily. CC:Dr Tawanna Cooler

## 2011-03-30 NOTE — Progress Notes (Signed)
Kiara Werner 1951/04/25 MRN [id number]      History of Present Illness:  This is a 60 year old African American female with chronic gastroesophageal reflux disease and upper abdominal discomfort with recent onset of constipation. Screening colonoscopy September 2005 was essentially normal. She denies any rectal bleeding. Her reflux has been controlled on Nexium 40 mg daily. Last upper endoscopy in October 2009 showed gastric inlet patch, reflux esophagitis. She takes Naprosyn 3 times a week. She has tried magnesium citrate for constipation. She has also tried fiber supplements.   Past Medical History  Diagnosis Date  . GERD (gastroesophageal reflux disease)   . Obesity   . Hypertension   . Hepatic cyst 06/02/10  . Ventral hernia, unspecified, without mention of obstruction or gangrene   . Pulmonary hypertension    Past Surgical History  Procedure Date  . Abdominal hysterectomy   . Tubal ligation   . Wrist surgery     cyst removal-right  . Carpal tunnel release     right    reports that she has never smoked. She does not have any smokeless tobacco history on file. She reports that she does not drink alcohol or use illicit drugs. family history includes Breast cancer in her cousin; Colon cancer in her maternal uncle; Colon polyps in her maternal uncle; Diabetes in her maternal aunt and paternal aunt; Heart disease in her mother; and Kidney disease in her maternal aunt. Not on File      Review of Systems: Denies dysphagia, odynophagia, chest pain or shortness of breath  The remainder of the 10  point ROS is negative except as outlined in H&P   Physical Exam: General appearance  Well developed in no distress. Eyes- non icteric. HEENT nontraumatic, normocephalic. Mouth no lesions, tongue papillated, no cheilosis. Neck supple without adenopathy, thyroid not enlarged, no carotid bruits, no JVD. Lungs Clear to auscultation bilaterally. Cor normal S1 normal S2, regular  rhythm , no murmur,  quiet precordium. Abdomen large obese soft abdomen with normal active bowel sounds. No tenderness. No mass. Liver edge at costal margin. Rectal: Soft strongly Hemoccult-positive stool. No external hemorrhoids. Extremities no pedal edema. Skin no lesions. Neurological alert and oriented x 3. Psychological normal mood and affect.  Assessment and Plan:  Proble #1 new-onset constipation. My exam today shows strongly heme positive stools which may be nonspecific but her colonoscopy was 7 years ago and so she will need another colonoscopy to rule out neoplasm. We are starting her on 17 g daily for chronic constipation. I have asked her to reduce her Naprosyn to minimum dosages.  Problem #2 Patient has gastroesophageal reflux controlled on Nexium 40 mg daily.    03/30/2011 Lina Sar

## 2011-04-01 ENCOUNTER — Telehealth: Payer: Self-pay | Admitting: *Deleted

## 2011-04-01 NOTE — Telephone Encounter (Signed)
Left a message for patient to call me. 

## 2011-04-01 NOTE — Telephone Encounter (Signed)
Message copied by Daphine Deutscher on Thu Apr 01, 2011 10:56 AM ------      Message from: Hart Carwin      Created: Thu Apr 01, 2011  8:00 AM       Please call pt with normal results

## 2011-04-05 NOTE — Telephone Encounter (Signed)
Patient was given results by Vernia Buff, CMA

## 2011-04-21 ENCOUNTER — Ambulatory Visit: Payer: Self-pay | Admitting: Internal Medicine

## 2011-04-22 ENCOUNTER — Encounter: Payer: Self-pay | Admitting: Internal Medicine

## 2011-04-22 ENCOUNTER — Ambulatory Visit (AMBULATORY_SURGERY_CENTER): Payer: BC Managed Care – PPO | Admitting: Internal Medicine

## 2011-04-22 DIAGNOSIS — R195 Other fecal abnormalities: Secondary | ICD-10-CM

## 2011-04-22 DIAGNOSIS — R109 Unspecified abdominal pain: Secondary | ICD-10-CM

## 2011-04-22 DIAGNOSIS — K5120 Ulcerative (chronic) proctitis without complications: Secondary | ICD-10-CM

## 2011-04-22 MED ORDER — HYDROCORTISONE ACETATE 25 MG RE SUPP: 25.0000 mg | Freq: Two times a day (BID) | RECTAL | Status: DC

## 2011-04-22 MED ORDER — SODIUM CHLORIDE 0.9 % IV SOLN: 500.0000 mL | INTRAVENOUS | Status: DC

## 2011-04-22 NOTE — Patient Instructions (Signed)
Please refer to blue and green discharge instruction sheets. 

## 2011-04-23 ENCOUNTER — Telehealth: Payer: Self-pay | Admitting: *Deleted

## 2011-04-23 NOTE — Telephone Encounter (Signed)

## 2011-04-27 ENCOUNTER — Encounter: Payer: Self-pay | Admitting: Internal Medicine

## 2011-06-22 LAB — BASIC METABOLIC PANEL
Calcium: 9.4
GFR calc Af Amer: >60
GFR calc non Af Amer: 52 — ABNORMAL LOW
Glucose, Bld: 97
Potassium: 3.3 — ABNORMAL LOW
Sodium: 142

## 2011-06-22 LAB — POCT HEMOGLOBIN-HEMACUE: Hemoglobin: 14.9

## 2011-09-02 ENCOUNTER — Ambulatory Visit (INDEPENDENT_AMBULATORY_CARE_PROVIDER_SITE_OTHER): Payer: BC Managed Care – PPO | Admitting: Family Medicine

## 2011-09-02 ENCOUNTER — Encounter: Payer: Self-pay | Admitting: Family Medicine

## 2011-09-02 ENCOUNTER — Ambulatory Visit: Payer: BC Managed Care – PPO | Admitting: Family Medicine

## 2011-09-02 VITALS — BP 130/78 | Temp 99.7°F | Wt 300.0 lb

## 2011-09-02 DIAGNOSIS — J069 Acute upper respiratory infection, unspecified: Secondary | ICD-10-CM

## 2011-09-02 MED ORDER — HYDROCODONE-HOMATROPINE 5-1.5 MG/5ML PO SYRP: ORAL_SOLUTION | ORAL | Status: DC

## 2011-09-02 NOTE — Progress Notes (Signed)
  Subjective:    Patient ID: Kiara Werner, female    DOB: 13-Oct-1950, 60 y.o.   MRN: [id number]  HPI Kiara Werner is a 60 year old single female, nonsmoker, who comes in today for a cold for 4 days.  She's had head congestion, sore throat, nonproductive cough.  No fever for 4 days.  Review of systems negative.  She complains of now, nocturia x 6, and would like a urologic evaluation   Review of Systems    In general, and pulmonary is systems otherwise negative Objective:   Physical Exam  Well-developed well-nourished, female, in no acute distress.  Examination of the HEENT were negative.  The neck was supple.  The thyroid is not enlarged.  No carotid bruits.  Chest is clear to auscultation.  No wheezing      Assessment & Plan:  Viral syndrome.  Plan treat symptomatically.  Nocturia x 6 referred to urology

## 2011-09-02 NOTE — Patient Instructions (Signed)
Drink lots of liquids.  Hydromet one half to 1 teaspoon 3 to 4 times daily p.r.n. Cough and cold.  Call the urology Center and set up a consult for evaluation of the nocturia

## 2011-09-03 ENCOUNTER — Ambulatory Visit: Payer: BC Managed Care – PPO | Admitting: Family Medicine

## 2011-09-29 ENCOUNTER — Other Ambulatory Visit: Payer: Self-pay | Admitting: Family Medicine

## 2011-09-29 DIAGNOSIS — Z1231 Encounter for screening mammogram for malignant neoplasm of breast: Secondary | ICD-10-CM

## 2011-10-07 ENCOUNTER — Ambulatory Visit: Payer: BC Managed Care – PPO | Admitting: Family Medicine

## 2011-10-14 ENCOUNTER — Ambulatory Visit: Payer: BC Managed Care – PPO | Admitting: Family Medicine

## 2011-10-26 ENCOUNTER — Other Ambulatory Visit: Payer: Self-pay | Admitting: Family Medicine

## 2011-10-28 ENCOUNTER — Ambulatory Visit
Admission: RE | Admit: 2011-10-28 | Discharge: 2011-10-28 | Disposition: A | Discharge status: Home / Self Care | Payer: BC Managed Care – PPO | Source: Ambulatory Visit | Attending: Family Medicine | Admitting: Family Medicine

## 2011-10-28 DIAGNOSIS — Z1231 Encounter for screening mammogram for malignant neoplasm of breast: Secondary | ICD-10-CM

## 2012-01-03 ENCOUNTER — Other Ambulatory Visit: Payer: Self-pay | Admitting: *Deleted

## 2012-01-03 MED ORDER — ESOMEPRAZOLE MAGNESIUM 40 MG PO CPDR: 40.0000 mg | DELAYED_RELEASE_CAPSULE | Freq: Every day | ORAL | Status: DC

## 2012-01-10 ENCOUNTER — Telehealth: Payer: Self-pay | Admitting: Family Medicine

## 2012-01-10 MED ORDER — ATENOLOL 50 MG PO TABS: 50.0000 mg | ORAL_TABLET | Freq: Every day | ORAL | Status: DC

## 2012-01-10 NOTE — Telephone Encounter (Signed)
Pt has sch ov for 02/08/12 at 1:45pm. Pt req refill of atenolol (TENORMIN) 50 MG tablet to [facility] on [address]. Pt is completely out of med.

## 2012-02-08 ENCOUNTER — Encounter: Payer: Self-pay | Admitting: Family Medicine

## 2012-02-08 ENCOUNTER — Ambulatory Visit (INDEPENDENT_AMBULATORY_CARE_PROVIDER_SITE_OTHER): Payer: BC Managed Care – PPO | Admitting: Family Medicine

## 2012-02-08 VITALS — BP 148/84 | Wt 307.4 lb

## 2012-02-08 DIAGNOSIS — E669 Obesity, unspecified: Secondary | ICD-10-CM

## 2012-02-08 DIAGNOSIS — I10 Essential (primary) hypertension: Secondary | ICD-10-CM

## 2012-02-08 LAB — HEMOGLOBIN A1C: Hgb A1c MFr Bld: 5.6 % (ref 4.6–6.5)

## 2012-02-08 LAB — CBC WITH DIFFERENTIAL/PLATELET
Basophils Absolute: 0 10*3/uL (ref 0.0–0.1)
Eosinophils Absolute: 0.1 10*3/uL (ref 0.0–0.7)
HCT: 41.8 % (ref 36.0–46.0)
Hemoglobin: 13.9 g/dL (ref 12.0–15.0)
Lymphs Abs: 1.5 10*3/uL (ref 0.7–4.0)
MCHC: 33.4 g/dL (ref 30.0–36.0)
Monocytes Relative: 6.2 % (ref 3.0–12.0)
Neutro Abs: 2.6 10*3/uL (ref 1.4–7.7)
RDW: 13.3 % (ref 11.5–14.6)

## 2012-02-08 LAB — POCT URINALYSIS DIPSTICK
Bilirubin, UA: NEGATIVE
Glucose, UA: NEGATIVE
Leukocytes, UA: NEGATIVE
Nitrite, UA: NEGATIVE
Urobilinogen, UA: 0.2
pH, UA: 5.5

## 2012-02-08 MED ORDER — ATENOLOL-CHLORTHALIDONE 50-25 MG PO TABS: 1.0000 | ORAL_TABLET | Freq: Every day | ORAL | Status: DC

## 2012-02-08 NOTE — Progress Notes (Signed)
  Subjective:    Patient ID: Kiara Werner, female    DOB: Apr 07, 1951, 61 y.o.   MRN: [id number]  HPIcathrine is a 61 year old female nonsmoker who comes in today for followup of hypertension  She's been on Tenormin 50 mg daily BP today 148/84 right arm sitting position and she's been complaining over the past 6 months a swelling of her legs. She states in the morning her legs are fine by the end of the day they're swollen.  She finds that difficult to lose weight. She is 307 pounds. She states she walks 30 minutes a day but is not losing any weight.  She is due to go back to the [facility] to have her CPAP readjusted. She states she feels fatigued during the day  Last physical examination February 2012    Review of Systems    general and cardiovascular review of systems otherwise negative Objective:   Physical Exam  Well-developed well-nourished female in no acute distress  BP right arm sitting position 150/84 trace to 1+ peripheral edema bilaterally      Assessment & Plan:  Hypertension with now peripheral edema and elevation of BP change to Tenoretic 50-25 daily BP check daily followup in 4 weeks

## 2012-02-08 NOTE — Patient Instructions (Signed)
Stop the Tenormin  Begin Tenoretic,,,,,,,,,,, 1 tablet daily in the morning  Check your blood pressure daily in the morning  Return sometime in the next 4-6 weeks for a 30 minute appointment for general physical examination  When you return and bring a record of all your blood pressure readings and the device

## 2012-02-09 LAB — HEPATIC FUNCTION PANEL
Alkaline Phosphatase: 50 U/L (ref 39–117)
Bilirubin, Direct: 0.1 mg/dL (ref 0.0–0.3)
Total Bilirubin: 0.6 mg/dL (ref 0.3–1.2)

## 2012-02-09 LAB — BASIC METABOLIC PANEL
BUN: 17 mg/dL (ref 6–23)
Calcium: 8.8 mg/dL (ref 8.4–10.5)
GFR: 66.04 mL/min (ref >60.00)
Glucose, Bld: 98 mg/dL (ref 70–99)
Potassium: 4.1 mEq/L (ref 3.5–5.1)
Sodium: 142 mEq/L (ref 135–145)

## 2012-02-09 LAB — LIPID PANEL
LDL Cholesterol: 84 mg/dL (ref 0–99)
Total CHOL/HDL Ratio: 4

## 2012-03-07 ENCOUNTER — Encounter: Payer: Self-pay | Admitting: Family Medicine

## 2012-03-07 ENCOUNTER — Ambulatory Visit (INDEPENDENT_AMBULATORY_CARE_PROVIDER_SITE_OTHER): Payer: BC Managed Care – PPO | Admitting: Family Medicine

## 2012-03-07 VITALS — BP 110/78 | Temp 98.6°F | Ht 72.0 in | Wt 308.9 lb

## 2012-03-07 DIAGNOSIS — N62 Hypertrophy of breast: Secondary | ICD-10-CM

## 2012-03-07 DIAGNOSIS — E669 Obesity, unspecified: Secondary | ICD-10-CM

## 2012-03-07 DIAGNOSIS — K219 Gastro-esophageal reflux disease without esophagitis: Secondary | ICD-10-CM

## 2012-03-07 DIAGNOSIS — R1013 Epigastric pain: Secondary | ICD-10-CM

## 2012-03-07 DIAGNOSIS — I10 Essential (primary) hypertension: Secondary | ICD-10-CM

## 2012-03-07 DIAGNOSIS — Z0000 Encounter for general adult medical examination without abnormal findings: Secondary | ICD-10-CM

## 2012-03-07 DIAGNOSIS — K3189 Other diseases of stomach and duodenum: Secondary | ICD-10-CM

## 2012-03-07 DIAGNOSIS — K439 Ventral hernia without obstruction or gangrene: Secondary | ICD-10-CM

## 2012-03-07 MED ORDER — ATENOLOL-CHLORTHALIDONE 50-25 MG PO TABS: 1.0000 | ORAL_TABLET | Freq: Every day | ORAL | Status: DC

## 2012-03-07 MED ORDER — ESOMEPRAZOLE MAGNESIUM 40 MG PO CPDR: 40.0000 mg | DELAYED_RELEASE_CAPSULE | Freq: Every day | ORAL | Status: DC

## 2012-03-07 NOTE — Patient Instructions (Signed)
Decrease the Tenoretic only take one half tablet daily in the morning and monitor your blood pressure for a month to be sure it stays normal,,,,,,,,,,,,, 135/85 or less  Consider a consult with Dr. Louisa Second plastic surgeon to consider reduction mammoplasty because of your chronic back pain and the grooves in her shoulders and the tears in the skin at the bras are causing

## 2012-03-07 NOTE — Progress Notes (Signed)
  Subjective:    Patient ID: Kiara Werner, female    DOB: 20-Aug-1951, 61 y.o.   MRN: [id number]  HPI Kiara Werner is a 61 year old single female nonsmoker who comes in today for general physical examination  She takes Tenoretic 50-25 daily for hypertension BP 110/78  She takes Nexium 40 mg daily for reflux esophagitis  Her weight is 308 pounds she has severe gynecomastia with grooves in her shoulders and today the skin is torn. Her bra size is 44 double D. I recommend she consider a reduction mammoplasty because of the grooves and shoulders the tear in the skin. She also has chronic back pain because of the gynecomastia  She had her uterus removed at age 61 ovaries were left intact surgery was done for fibroids no cancer tetanus 2008   Review of Systems  Constitutional: Negative.   HENT: Negative.   Eyes: Negative.   Respiratory: Negative.   Cardiovascular: Negative.   Gastrointestinal: Negative.   Genitourinary: Negative.   Musculoskeletal: Positive for back pain.  Neurological: Negative.   Hematological: Negative.   Psychiatric/Behavioral: Negative.        Objective:   Physical Exam  Constitutional: She appears well-developed and well-nourished.  HENT:  Head: Normocephalic and atraumatic.  Right Ear: External ear normal.  Left Ear: External ear normal.  Nose: Nose normal.  Mouth/Throat: Oropharynx is clear and moist.  Eyes: EOM are normal. Pupils are equal, round, and reactive to light.  Neck: Normal range of motion. Neck supple. No thyromegaly present.  Cardiovascular: Normal rate, regular rhythm, normal heart sounds and intact distal pulses.  Exam reveals no gallop and no friction rub.   No murmur heard. Pulmonary/Chest: Effort normal and breath sounds normal.  Abdominal: Soft. Bowel sounds are normal. She exhibits no distension and no mass. There is no tenderness. There is no rebound.  Genitourinary:       Bilateral breast exam shows severe gynecomastia in the  sitting position her breasts rest on her anterior thighs. No palpable masses. There is improved in both shoulders and in the right shoulder the skin is torn. She has difficulty finding bras that fit  because of her large breast size  Musculoskeletal: Normal range of motion.  Lymphadenopathy:    She has no cervical adenopathy.  Neurological: She is alert. She has normal reflexes. No cranial nerve deficit. She exhibits normal muscle tone. Coordination normal.  Skin: Skin is warm and dry.  Psychiatric: She has a normal mood and affect. Her behavior is normal. Judgment and thought content normal.          Assessment & Plan:  Healthy female  Hypertension decrease Tenoretic to half a tab a day monitor blood pressure to be sure it stays normal  Reflux esophagitis Nexium 40 daily  Severe gynecomastia recommend consult with Dr. Shon Hough for reduction mammoplasty  History of hysterectomy at age 61 for fibroids no cancer

## 2012-05-01 ENCOUNTER — Ambulatory Visit (INDEPENDENT_AMBULATORY_CARE_PROVIDER_SITE_OTHER): Payer: BC Managed Care – PPO | Admitting: Family Medicine

## 2012-05-01 ENCOUNTER — Encounter: Payer: Self-pay | Admitting: Family Medicine

## 2012-05-01 VITALS — BP 130/84 | Temp 98.2°F | Wt 309.4 lb

## 2012-05-01 DIAGNOSIS — R4789 Other speech disturbances: Secondary | ICD-10-CM

## 2012-05-01 DIAGNOSIS — R4702 Dysphasia: Secondary | ICD-10-CM

## 2012-05-01 NOTE — Patient Instructions (Signed)
Call [phone] asked for GI and make an appointment to see Dr. Dorthea Cove ASAP  A full liquid diet no solid food

## 2012-05-01 NOTE — Progress Notes (Signed)
  Subjective:    Patient ID: Kiara Werner, female    DOB: 1951-09-12, 61 y.o.   MRN: [id number]  HPI Kiara Werner is a 61 year old female who comes in today with a week's history of dysphasia  She states it started about a week or 10 days ago she had a sensation of food getting stuck in her upper esophagus. She's had no fever chills nausea vomiting diarrhea weight loss etc.    Review of Systems    general and GI review of systems negative Objective:   Physical Exam Well-developed and nourished female no acute distress oral cavity normal neck normal       Assessment & Plan:  Dysphasia as questioned upper esophageal stricture plan liquid diet refer to GI ASAP Dr. Dorthea Cove who did her colonoscopy

## 2012-05-02 ENCOUNTER — Encounter: Payer: Self-pay | Admitting: Physician Assistant

## 2012-05-02 ENCOUNTER — Telehealth: Payer: Self-pay | Admitting: Internal Medicine

## 2012-05-02 NOTE — Telephone Encounter (Signed)
Scheduled patient with Mike Gip, PA on 05/03/12 at 9:00 AM.

## 2012-05-03 ENCOUNTER — Ambulatory Visit (INDEPENDENT_AMBULATORY_CARE_PROVIDER_SITE_OTHER): Payer: BC Managed Care – PPO | Admitting: Physician Assistant

## 2012-05-03 ENCOUNTER — Encounter: Payer: Self-pay | Admitting: Physician Assistant

## 2012-05-03 VITALS — BP 134/68 | HR 60 | Ht 72.0 in | Wt 311.0 lb

## 2012-05-03 DIAGNOSIS — R1310 Dysphagia, unspecified: Secondary | ICD-10-CM

## 2012-05-03 DIAGNOSIS — K219 Gastro-esophageal reflux disease without esophagitis: Secondary | ICD-10-CM

## 2012-05-03 MED ORDER — MAGIC MOUTHWASH: 5.0000 mL | Freq: Four times a day (QID) | ORAL | Status: DC

## 2012-05-03 MED ORDER — ESOMEPRAZOLE MAGNESIUM 40 MG PO CPDR: 40.0000 mg | DELAYED_RELEASE_CAPSULE | Freq: Two times a day (BID) | ORAL | Status: DC

## 2012-05-03 NOTE — Patient Instructions (Addendum)
We have sent the following medications to your pharmacy for you to pick up at your convenience: Nexium 40mg   Two times a day.  Magic Mouthwash.Please take as directed 4 times a day for 10 days.  If you are not significantly better within 2 weeks please call our office [phone] and ask to speak to Dr. Regino Schultze Nurse. You have a follow up with Dr. Juanda Chance on 06/20/2012 @ 9:45am

## 2012-05-03 NOTE — Progress Notes (Signed)
Subjective:    Patient ID: Kiara Werner, female    DOB: 1951-07-15, 61 y.o.   MRN: [id number]  HPI Kiara Werner is a pleasant 61 year old female known to Dr. Lina Sar who has history of chronic GERD. She had undergone prior upper endoscopy in October of 2009 which showed some minimal esophagitis. She had biopsies to rule out Barrett's and these were negative. She also had colonoscopy in August of 2012 which was negative with the exception of some mild proctitis. Biopsies showed benign colonic tissue. She is maintained on Nexium 40 mg by mouth daily for her reflux symptoms. Today she comes in with complaints of soreness in the back of her throat over the past week. She says she felt like there was something stuck in the back of her pharynx for a few days like a particle of food which caused her to have a lot of coughing and clearing of her throat,and an increase production of phlegm. She said she was able to cough up to something and then after that has had some persistent soreness which is actually been better over the past couple of days. She's feeling this on the left side of her pharynx. On careful questioning she really does not have any dysphagia oral dying aphasia. She has had no fever or chills. No recent antibiotics or changes in her medications.  She is no complaint of abdominal pain nausea vomiting etc. She does feel that she's had an increase in heartburn indigestion recently and that the once daily and Nexium is not controlling her symptoms well. She does not have any nighttime symptoms but generally notices an increase in heartburn after meals.    Review of Systems  Constitutional: Negative.   HENT: Positive for sore throat.   Eyes: Negative.   Respiratory: Negative.   Cardiovascular: Negative.   Gastrointestinal: Negative.   Genitourinary: Negative.   Musculoskeletal: Negative.   Skin: Negative.   Neurological: Negative.   Hematological: Negative.   Psychiatric/Behavioral:  Negative.    Outpatient Prescriptions Prior to Visit  Medication Sig Dispense Refill  . aspirin 81 MG tablet Take 81 mg by mouth daily.      Marland Kitchen atenolol-chlorthalidone (TENORETIC 50) 50-25 MG per tablet Take 1 tablet by mouth daily.  100 tablet  3  . esomeprazole (NEXIUM) 40 MG capsule Take 1 capsule (40 mg total) by mouth daily before breakfast.  100 capsule  3   Facility-Administered Medications Prior to Visit  Medication Dose Route Frequency Provider Last Rate Last Dose  . 0.9 %  sodium chloride infusion  500 mL Intravenous Continuous Hart Carwin, MD          No Known Allergies Patient Active Problem List  Diagnosis  . DERMATOMYCOSIS NOS  . OBESITY NOS  . HYPERTENSION  . SORE THROAT  . GERD  . DYSPEPSIA  . VENTRAL HERNIA  . BONE SPUR  . Viral URI  . Gynecomastia, female  . Dysphasia   History   Social History  . Marital Status: Divorced    Spouse Name: N/A    Number of Children: 2  . Years of Education: N/A   Occupational History  . 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 Smoking status: Never Smoker   . Smokeless tobacco: Current User    Types: Snuff  . Alcohol Use: No  . Drug Use: No  . Sexually Active: Not on file   Other Topics Concern  . Not  on file   Social History Narrative   Malen Gauze mother   Objective:   Physical Exam well-developed AA female in no acute distress, pleasant blood pressure 134/68 pulse 60 height 6 foot weight 311. HEENT nontraumatic normocephalic EOMI PERRLA sclera anicteric, She has some slight tenderness and fullness along the left submandibular area no palpable mass or adenopathy Neck; Supple no JVD., Cardiovascular; regular rate and rhythm with S1-S2 no murmur or gallop, Pulmonary; clear bilaterally, Abdomen; large soft nontender nondistended bowel sounds are active there is no palpable mass or hepatosplenomegaly, Rectal; exam not done, Extremities; no clubbing cyanosis or edema skin warm and dry, Psych;  mood and affect normal and appropriate        Assessment & Plan:  #34 61 year old female with acute pharyngeal irritation and what sounds like an abrasion which is improving over the past few days. #2 chronic GERD-currently poorly controlled on once daily Nexium #3 colon neoplasia screening-is up-to-date with colonoscopy which was done August 2012  Plan; Magic mouthwash 5 cc gargle and swallow 4 times daily over the next 10 days Increase Nexium to 40 mg by mouth a.c. breakfast and a.c. dinner at least over the next one to 2 months Reviewed antireflux regimen with the patient and she was also given educational information And followup with Dr. Lina Sar in 3 to four-weeks-Patient was told that if her symptoms or not significantly improved in the next 2 weeks, to call back and we may schedule for upper endoscopy at that time

## 2012-05-03 NOTE — Progress Notes (Signed)
I agree, sounds like irritated oropharynx.

## 2012-05-19 ENCOUNTER — Telehealth: Payer: Self-pay | Admitting: *Deleted

## 2012-05-19 DIAGNOSIS — K219 Gastro-esophageal reflux disease without esophagitis: Secondary | ICD-10-CM

## 2012-05-19 MED ORDER — ESOMEPRAZOLE MAGNESIUM 40 MG PO CPDR: 40.0000 mg | DELAYED_RELEASE_CAPSULE | Freq: Two times a day (BID) | ORAL | Status: DC

## 2012-05-19 NOTE — Telephone Encounter (Signed)
rx sent

## 2012-06-02 ENCOUNTER — Encounter: Payer: Self-pay | Admitting: *Deleted

## 2012-06-20 ENCOUNTER — Encounter: Payer: Self-pay | Admitting: Internal Medicine

## 2012-06-20 ENCOUNTER — Ambulatory Visit (INDEPENDENT_AMBULATORY_CARE_PROVIDER_SITE_OTHER): Payer: BC Managed Care – PPO | Admitting: Internal Medicine

## 2012-06-20 VITALS — BP 110/60 | HR 60 | Ht 71.25 in | Wt 312.1 lb

## 2012-06-20 DIAGNOSIS — K219 Gastro-esophageal reflux disease without esophagitis: Secondary | ICD-10-CM

## 2012-06-20 MED ORDER — ESOMEPRAZOLE MAGNESIUM 40 MG PO CPDR: 40.0000 mg | DELAYED_RELEASE_CAPSULE | Freq: Two times a day (BID) | ORAL | Status: DC

## 2012-06-20 NOTE — Progress Notes (Signed)
CHANDA LAPERLE 25-Mar-1951 MRN [id number]   History of Present Illness:  This is a 61 year old African American female with chronic gastroesophageal reflux confirmed on her last upper endoscopy in 2009 which showed esophagitis but no evidence of Barrett's esophagus. She was recently seen by Mike Gip, PA-C for a globus sensation , sore throat, cough and increased secretions. She was also having increasing heartburn. Her Nexium was increased to 40 mg twice a day with good results. She currently denies any dysphagia but she still complains of increased secretions in the mornings. She is following antireflux measures. She is up-to-date on her colonoscopy which was done in August 2012 and showed mild proctitis.   Past Medical History  Diagnosis Date  . GERD (gastroesophageal reflux disease)   . Obesity   . Hypertension   . Hepatic cyst 06/02/10  . Ventral hernia, unspecified, without mention of obstruction or gangrene   . Pulmonary hypertension   . Proctitis    Past Surgical History  Procedure Date  . Abdominal hysterectomy   . Tubal ligation   . Wrist surgery     cyst removal-right  . Carpal tunnel release     right    reports that she has never smoked. Her smokeless tobacco use includes Snuff. She reports that she does not drink alcohol or use illicit drugs. family history includes Breast cancer in her cousin; Colon cancer in her maternal uncle; Colon polyps in her maternal uncle; Diabetes in her maternal aunt and paternal aunt; Heart disease in her mother; and Kidney disease in her maternal aunt. No Known Allergies      Review of Systems: Negative for dysphagia chest pain positive for weight gain  The remainder of the 10 point ROS is negative except as outlined in H&P   Physical Exam: General appearance  Well developed, in no distress. Overweight Eyes- non icteric. HEENT nontraumatic, normocephalic. Mouth no lesions, tongue papillated, no cheilosis. Neck supple  without adenopathy, thyroid not enlarged, no carotid bruits, no JVD. Lungs Clear to auscultation bilaterally. Cor normal S1, normal S2, regular rhythm, no murmur,  quiet precordium. Abdomen: Mild tenderness in epigastrium and subxiphoid area. Rest of the abdomen is unremarkable. Rectal: Not done. Extremities no pedal edema. Skin no lesions. Neurological alert and oriented x 3. Psychological normal mood and affect.  Assessment and Plan:  Problem #1 Chronic gastroesophageal reflux with globus sensation, improved on increased doses of PPI. There is no history of Barrett's esophagus. She will continue on the same regimen of Nexium 40 mg twice a day and antireflux measures. I have encouraged her to continue to lose weight. If the symptoms become refractory to her medications, we will consider repeating an upper endoscopy. We discussed the possibility of a hiatal hernia causing part of her problem.  Problem #2 Colorectal screening. Her last colonoscopy was in August 2012. A recall colonoscopy will be due in August 2022.   06/20/2012 Lina Sar

## 2012-06-20 NOTE — Patient Instructions (Addendum)
We have sent the following medications to your pharmacy for you to pick up at your convenience: Nexium CC: Dr Kelle Darting

## 2012-10-03 ENCOUNTER — Other Ambulatory Visit: Payer: Self-pay | Admitting: Family Medicine

## 2012-10-03 DIAGNOSIS — Z1231 Encounter for screening mammogram for malignant neoplasm of breast: Secondary | ICD-10-CM

## 2012-10-30 ENCOUNTER — Ambulatory Visit
Admission: RE | Admit: 2012-10-30 | Discharge: 2012-10-30 | Disposition: A | Discharge status: Home / Self Care | Payer: BC Managed Care – PPO | Source: Ambulatory Visit | Attending: Family Medicine | Admitting: Family Medicine

## 2012-10-30 DIAGNOSIS — Z1231 Encounter for screening mammogram for malignant neoplasm of breast: Secondary | ICD-10-CM

## 2012-11-09 ENCOUNTER — Encounter: Payer: Self-pay | Admitting: Family Medicine

## 2012-11-09 ENCOUNTER — Ambulatory Visit (INDEPENDENT_AMBULATORY_CARE_PROVIDER_SITE_OTHER): Payer: BC Managed Care – PPO | Admitting: Family Medicine

## 2012-11-09 VITALS — BP 124/80 | Temp 98.6°F | Wt 312.0 lb

## 2012-11-09 DIAGNOSIS — K219 Gastro-esophageal reflux disease without esophagitis: Secondary | ICD-10-CM

## 2012-11-09 DIAGNOSIS — E669 Obesity, unspecified: Secondary | ICD-10-CM

## 2012-11-09 DIAGNOSIS — J029 Acute pharyngitis, unspecified: Secondary | ICD-10-CM

## 2012-11-09 NOTE — Progress Notes (Signed)
  Subjective:    Patient ID: Kiara Werner, female    DOB: 02-22-1951, 62 y.o.   MRN: [id number]  HPI [name] is a 62 year old single female nonsmoker who comes in today for evaluation of reflux esophagitis  She's had a long-standing history of reflux esophagitis and is on Nexium 40 mg daily in the morning. For the last week and has she's noticed difficulty swallowing and a sensation of her throat closing. No fever chills vomiting no food getting stuck in her upper esophagus although she says she is having difficulty swallowing.  Last GI evaluation was 6 months ago   Review of Systems    review of systems otherwise negative Objective:   Physical Exam  Well-developed well-nourished female no acute distress examination of the oral cavity was normal neck was supple no adenopathy thyroid normal      Assessment & Plan:  Flare of reflux esophagitis plan anti-reflex treatment program GI consult was symptoms persist

## 2012-11-09 NOTE — Patient Instructions (Signed)
Nothing ear drink for 3 hours before bedtime  Sleep on 2 pillows  No caffeine peppermint alcohol or tobacco  Double the Nexium take one in the morning and a second dose before your evening meal  Within a week or 2 if her symptoms abate then go down to one Nexium a day  If in a week or 2 your symptoms do not abate then call GI

## 2012-11-28 ENCOUNTER — Encounter: Payer: Self-pay | Admitting: Internal Medicine

## 2012-11-28 ENCOUNTER — Ambulatory Visit (INDEPENDENT_AMBULATORY_CARE_PROVIDER_SITE_OTHER): Payer: BC Managed Care – PPO | Admitting: Internal Medicine

## 2012-11-28 VITALS — BP 106/72 | HR 64 | Ht 71.25 in | Wt 313.0 lb

## 2012-11-28 DIAGNOSIS — K219 Gastro-esophageal reflux disease without esophagitis: Secondary | ICD-10-CM

## 2012-11-28 MED ORDER — ESOMEPRAZOLE MAGNESIUM 40 MG PO CPDR: 40.0000 mg | DELAYED_RELEASE_CAPSULE | Freq: Two times a day (BID) | ORAL | Status: DC

## 2012-11-28 NOTE — Patient Instructions (Addendum)
You have been scheduled for an endoscopy with propofol. Please follow written instructions given to you at your visit today. If you use inhalers (even only as needed), please bring them with you on the day of your procedure.  We have sent the following medications to your pharmacy for you to pick up at your convenience: Nexium  CC: Dr Kelle Darting

## 2012-11-28 NOTE — Progress Notes (Signed)
Kiara Werner [birth year] MRN [id number]  History of Present Illness:  This is a 62 year old African American female with chronic gastroesophageal reflux and esophagitis on an upper endoscopy in 2009. She has been complaining of globus sensation. Biopsies from the GE junction on the last endoscopy did not show any evidence of Barrett's esophagus. Her last colonoscopy in August 2012 showed nonspecific proctitis. She has had an exacerbation of gastroesophageal reflux characterized by burning and substernal pain. She has been taking Aleve 2 tablets every morning. She was on Nexium 40 mg once a day but increased it to 2 a day about 2 weeks ago with some improvement.  She denies dysphagia or odynophagia. She has expectorated secretions tinged with bright red blood. Patient does not smoke or drink any alcohol. She has been overweight.   Past Medical History  Diagnosis Date  . GERD (gastroesophageal reflux disease)   . Obesity   . Hypertension   . Hepatic cyst 06/02/10  . Ventral hernia, unspecified, without mention of obstruction or gangrene   . Pulmonary hypertension   . Proctitis    Past Surgical History  Procedure Laterality Date  . Abdominal hysterectomy    . Tubal ligation    . Wrist surgery      cyst removal-right  . Carpal tunnel release      right    reports that she has never smoked. She has quit using smokeless tobacco. Her smokeless tobacco use included Snuff. She reports that she does not drink alcohol or use illicit drugs. family history includes Breast cancer in her cousin; Colon cancer in her maternal uncle; Colon polyps in her maternal uncle; Diabetes in her maternal aunt and paternal aunt; Heart disease in her mother; and Kidney disease in her maternal aunt. No Known Allergies      Review of Systems: Negative for diarrhea constipation abdominal pain or dysphagia  The remainder of the 10 point ROS is negative except as outlined in H&P   Physical Exam: General  appearance  Well developed, in no distress. Overweight Eyes- non icteric. HEENT nontraumatic, normocephalic. Normal voice Mouth no lesions, tongue papillated, no cheilosis. Neck supple without adenopathy, thyroid not enlarged, no carotid bruits, no JVD. Lungs Clear to auscultation bilaterally. Cor normal S1, normal S2, regular rhythm, no murmur,  quiet precordium. Abdomen:  Obese soft, nontender. Normoactive bowel sounds Skin no lesions. Neurological alert and oriented x 3. Psychological normal mood and affect.  Assessment and Plan:  Problem #1 Chronic gastroesophageal reflux disease with exacerbation. It has been refractory to high-dose PPI; Nexium 40 mg twice a day. Aleve may be causing esophagitis/gastritis. I have asked her to hold it for now and continue Nexium 40 mg by mouth twice a day as well as antacids when necessary for breakthrough symptoms. I have instructed her again in antireflux measures. We will schedule an upper endoscopy and biopsies to rule out Barrett's esophagus and to try to explain blood in the sputum. Colorectal screening. Patient is up-to-date on her colonoscopy  11/28/2012 Lina Sar

## 2012-12-01 ENCOUNTER — Encounter: Payer: Self-pay | Admitting: Internal Medicine

## 2012-12-01 ENCOUNTER — Ambulatory Visit (AMBULATORY_SURGERY_CENTER): Payer: BC Managed Care – PPO | Admitting: Internal Medicine

## 2012-12-01 VITALS — BP 117/73 | HR 55 | Resp 17 | Ht 71.25 in | Wt 313.0 lb

## 2012-12-01 DIAGNOSIS — K2990 Gastroduodenitis, unspecified, without bleeding: Secondary | ICD-10-CM

## 2012-12-01 DIAGNOSIS — K2960 Other gastritis without bleeding: Secondary | ICD-10-CM

## 2012-12-01 DIAGNOSIS — R1013 Epigastric pain: Secondary | ICD-10-CM

## 2012-12-01 DIAGNOSIS — K3189 Other diseases of stomach and duodenum: Secondary | ICD-10-CM

## 2012-12-01 DIAGNOSIS — D130 Benign neoplasm of esophagus: Secondary | ICD-10-CM

## 2012-12-01 DIAGNOSIS — K219 Gastro-esophageal reflux disease without esophagitis: Secondary | ICD-10-CM

## 2012-12-01 DIAGNOSIS — K2970 Gastritis, unspecified, without bleeding: Secondary | ICD-10-CM

## 2012-12-01 MED ORDER — SODIUM CHLORIDE 0.9 % IV SOLN: 500.0000 mL | INTRAVENOUS | Status: DC

## 2012-12-01 NOTE — Progress Notes (Signed)
Patient did not experience any of the following events: a burn prior to discharge; a fall within the facility; wrong site/side/patient/procedure/implant event; or a hospital transfer or hospital admission upon discharge from the facility. (G8907) Patient did not have preoperative order for IV antibiotic SSI prophylaxis. (G8918)  

## 2012-12-01 NOTE — Patient Instructions (Addendum)
YOU HAD AN ENDOSCOPIC PROCEDURE TODAY AT THE [facility]: Refer to the procedure report that was given to you for any specific questions about what was found during the examination.  If the procedure report does not answer your questions, please call your gastroenterologist to clarify.  If you requested that your care partner not be given the details of your procedure findings, then the procedure report has been included in a sealed envelope for you to review at your convenience later.  YOU SHOULD EXPECT: Some feelings of bloating in the abdomen. Passage of more gas than usual.  Walking can help get rid of the air that was put into your GI tract during the procedure and reduce the bloating. If you had a lower endoscopy (such as a colonoscopy or flexible sigmoidoscopy) you may notice spotting of blood in your stool or on the toilet paper. If you underwent a bowel prep for your procedure, then you may not have a normal bowel movement for a few days.  DIET: Your first meal following the procedure should be a light meal and then it is ok to progress to your normal diet.  A half-sandwich or bowl of soup is an example of a good first meal.  Heavy or fried foods are harder to digest and may make you feel nauseous or bloated.  Likewise meals heavy in dairy and vegetables can cause extra gas to form and this can also increase the bloating.  Drink plenty of fluids but you should avoid alcoholic beverages for 24 hours.  ACTIVITY: Your care partner should take you home directly after the procedure.  You should plan to take it easy, moving slowly for the rest of the day.  You can resume normal activity the day after the procedure however you should NOT DRIVE or use heavy machinery for 24 hours (because of the sedation medicines used during the test).    SYMPTOMS TO REPORT IMMEDIATELY: A gastroenterologist can be reached at any hour.  During normal business hours, 8:30 AM to 5:00 PM Monday through Friday,  call [phone].  After hours and on weekends, please call the GI answering service at [phone] who will take a message and have the physician on call contact you.    Following upper endoscopy (EGD)  Vomiting of blood or coffee ground material  New chest pain or pain under the shoulder blades  Painful or persistently difficult swallowing  New shortness of breath  Fever of 100F or higher  Black, tarry-looking stools  FOLLOW UP: If any biopsies were taken you will be contacted by phone or by letter within the next 1-3 weeks.  Call your gastroenterologist if you have not heard about the biopsies in 3 weeks.  Our staff will call the home number listed on your records the next business day following your procedure to check on you and address any questions or concerns that you may have at that time regarding the information given to you following your procedure. This is a courtesy call and so if there is no answer at the home number and we have not heard from you through the emergency physician on call, we will assume that you have returned to your regular daily activities without incident.   Hold Aleve til symptoms subside, then take one at a time as needed  SIGNATURES/CONFIDENTIALITY: You and/or your care partner have signed paperwork which will be entered into your electronic medical record.  These signatures attest to the fact that that the  information above on your After Visit Summary has been reviewed and is understood.  Full responsibility of the confidentiality of this discharge information lies with you and/or your care-partner.

## 2012-12-01 NOTE — Op Note (Signed)
[facility] [address] [place], [address]   ENDOSCOPY PROCEDURE REPORT  PATIENT: Kiara Werner, Kiara Werner  MR#: [id number] BIRTHDATE: [birth year] , 61  yrs. old GENDER: Female ENDOSCOPIST: Hart Carwin, MD REFERRED BY:  Roderick Pee, M.D. PROCEDURE DATE:  12/01/2012 PROCEDURE:  EGD w/ biopsy ASA CLASS:     Class III INDICATIONS:  Heartburn.   History of esophageal reflux.   therapy of GERD.   last EGD 2009 showed esophagitis. MEDICATIONS: MAC sedation, administered by CRNA and propofol (Diprivan) 200mg  IV TOPICAL ANESTHETIC: Cetacaine Spray  DESCRIPTION OF PROCEDURE: After the risks benefits and alternatives of the procedure were thoroughly explained, informed consent was obtained.  The LB GIF-H180 [id number] endoscope was introduced through the mouth and advanced to the second portion of the duodenum. Without limitations.  The instrument was slowly withdrawn as the mucosa was fully examined.        ESOPHAGUS: Reflux esophagitis was found at the gastroesophageal junction.  Esophagitis was LA Grade A: breaks across 1-2 folds, <5 mm.  A biopsy was performed using cold forceps. Z-line was irregular.  Sample obtained to rule out Barrett's esophagus.  STOMACH: Mild gastritis (inflammation) was found in the gastric body.  A biopsy was performed using cold forceps.  Sample obtained for helicobacter pylori testing.  Retroflexed views revealed no abnormalities.     The scope was then withdrawn from the patient and the procedure completed.  COMPLICATIONS: There were no complications. ENDOSCOPIC IMPRESSION: 1.   Esophagitis consistent with reflux esophagitis at the gastroesophageal junction; biopsy , irregulat z-line, no stricture 2.   Gastritis (inflammation) was found in the gastric body; biopsy to r/o H.pylori  RECOMMENDATIONS: 1.  Await pathology results 2.  Anti-reflux regimen to be follow 3.  Continue PPI 4.  continue antacids prn 5.  hold Aleve   till symptoms subside, then take one at a time prn  REPEAT EXAM: .  no recall unless Barrett;s on biopsies  eSigned:  Hart Carwin, MD 12/01/2012 8:35 AM   CC:  PATIENT NAME:  Kiara Werner, Kiara Werner MR#: [id number]

## 2012-12-01 NOTE — Progress Notes (Signed)
Called to room to assist during endoscopic procedure.  Patient ID and intended procedure confirmed with present staff. Received instructions for my participation in the procedure from the performing physician. ewm 

## 2012-12-04 ENCOUNTER — Telehealth: Payer: Self-pay | Admitting: *Deleted

## 2012-12-04 NOTE — Telephone Encounter (Signed)
  Follow up Call-  Call back number 12/01/2012 04/22/2011  Post procedure Call Back phone  # [phone], or work [phone]  Permission to leave phone message Yes -     Patient questions:  Do you have a fever, pain , or abdominal swelling? no Pain Score  0 *  Have you tolerated food without any problems? yes  Have you been able to return to your normal activities? yes  Do you have any questions about your discharge instructions: Diet   no Medications  no Follow up visit  no  Do you have questions or concerns about your Care? no  Actions: * If pain score is 4 or above: No action needed, pain <4.

## 2012-12-21 ENCOUNTER — Encounter: Payer: Self-pay | Admitting: Internal Medicine

## 2013-01-29 ENCOUNTER — Ambulatory Visit (INDEPENDENT_AMBULATORY_CARE_PROVIDER_SITE_OTHER): Payer: BC Managed Care – PPO | Admitting: Family Medicine

## 2013-01-29 ENCOUNTER — Encounter: Payer: Self-pay | Admitting: Family Medicine

## 2013-01-29 VITALS — BP 110/70 | Temp 99.4°F | Wt 312.0 lb

## 2013-01-29 DIAGNOSIS — J45909 Unspecified asthma, uncomplicated: Secondary | ICD-10-CM | POA: Insufficient documentation

## 2013-01-29 MED ORDER — PREDNISONE 20 MG PO TABS: ORAL_TABLET | ORAL | Status: DC

## 2013-01-29 NOTE — Progress Notes (Signed)
  Subjective:    Patient ID: Kiara Werner, female    DOB: August 23, 1951, 62 y.o.   MRN: [id number]  HPI Kiara Werner is a 62 year old female nonsmoker who has a history of underlying allergic rhinitis however in the past week her allergies have gotten worse and she's been coughing. She's never had asthma in the past. She's able to sleep at night but she does cough a lot at night. She does not recall any specific trigger   Review of Systems Review of systems otherwise negative    Objective:   Physical Exam Obese female no acute distress HEENT negative neck was supple no adenopathy lungs are clear except for some symmetrical late expiratory wheezing mild       Assessment & Plan:  Allergic rhinitis with asthma plan prednisone burst and taper

## 2013-01-29 NOTE — Patient Instructions (Signed)
Take the prednisone as directed return when necessary 

## 2013-03-23 ENCOUNTER — Other Ambulatory Visit: Payer: Self-pay | Admitting: Family Medicine

## 2013-07-03 ENCOUNTER — Ambulatory Visit: Payer: BC Managed Care – PPO | Admitting: Family Medicine

## 2013-08-24 ENCOUNTER — Telehealth: Payer: Self-pay | Admitting: Family Medicine

## 2013-08-24 NOTE — Telephone Encounter (Signed)
Pt is losing ability to ability to write and/or type. Pt would like appt asap. Nothing except 12/18 and pt states that is too far out. pls advise.

## 2013-08-27 ENCOUNTER — Other Ambulatory Visit: Payer: Self-pay | Admitting: *Deleted

## 2013-08-27 DIAGNOSIS — K219 Gastro-esophageal reflux disease without esophagitis: Secondary | ICD-10-CM

## 2013-08-27 MED ORDER — ESOMEPRAZOLE MAGNESIUM 40 MG PO CPDR: 40.0000 mg | DELAYED_RELEASE_CAPSULE | Freq: Two times a day (BID) | ORAL | Status: DC

## 2013-08-27 NOTE — Telephone Encounter (Signed)
Per Dr Tawanna Cooler patient should be seen at neurology.  Patient is aware and agrees.

## 2013-08-30 ENCOUNTER — Encounter: Payer: Self-pay | Admitting: Neurology

## 2013-08-30 ENCOUNTER — Ambulatory Visit (INDEPENDENT_AMBULATORY_CARE_PROVIDER_SITE_OTHER): Payer: BC Managed Care – PPO | Admitting: Neurology

## 2013-08-30 VITALS — BP 126/76 | HR 74 | Temp 99.3°F | Ht 74.0 in | Wt 318.3 lb

## 2013-08-30 DIAGNOSIS — R471 Dysarthria and anarthria: Secondary | ICD-10-CM

## 2013-08-30 DIAGNOSIS — R490 Dysphonia: Secondary | ICD-10-CM

## 2013-08-30 DIAGNOSIS — Z5181 Encounter for therapeutic drug level monitoring: Secondary | ICD-10-CM

## 2013-08-30 DIAGNOSIS — E669 Obesity, unspecified: Secondary | ICD-10-CM

## 2013-08-30 DIAGNOSIS — I10 Essential (primary) hypertension: Secondary | ICD-10-CM

## 2013-08-30 DIAGNOSIS — J383 Other diseases of vocal cords: Secondary | ICD-10-CM

## 2013-08-30 LAB — VITAMIN B12: Vitamin B-12: 306 pg/mL (ref 211–911)

## 2013-08-30 LAB — CBC
HCT: 41.3 % (ref 36.0–46.0)
MCHC: 33.9 g/dL (ref 30.0–36.0)
MCV: 85.8 fl (ref 78.0–100.0)
Platelets: 206 10*3/uL (ref 150.0–400.0)

## 2013-08-30 LAB — CK: Total CK: 263 U/L — ABNORMAL HIGH (ref 7–177)

## 2013-08-30 LAB — COMPREHENSIVE METABOLIC PANEL
AST: 19 U/L (ref 0–37)
Albumin: 3.8 g/dL (ref 3.5–5.2)
Alkaline Phosphatase: 46 U/L (ref 39–117)
BUN: 18 mg/dL (ref 6–23)
Creatinine, Ser: 1.2 mg/dL (ref 0.4–1.2)
Potassium: 4.3 mEq/L (ref 3.5–5.1)
Total Bilirubin: 0.6 mg/dL (ref 0.3–1.2)

## 2013-08-30 LAB — SEDIMENTATION RATE: Sed Rate: 19 mm/hr (ref 0–22)

## 2013-08-30 NOTE — Patient Instructions (Addendum)
Mri Brain w/wo - December 15@9 :00 [facility] [address] [place] [phone] #[phone] Please give [place] a call to schedule a follow up after your imaging.

## 2013-08-30 NOTE — Progress Notes (Signed)
Kiara Werner was seen today in neurologic consultation at the request of TODD,JEFFREY ALLEN, MD.  The consultation is for the evaluation of difficulty with writing and typing.  The pt is R hand dominant.  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  A few months ago, she was notarizing documents and she noted that it was troublesome signing her name.  Her hand just wouldn't make the motion.  It was better the next day so she had cx the appt with Dr. Tawanna Cooler that she made.  She states that she still feels it some but not as bad as it was.  It happens in both hands.  She is also noting "hard cramps" in the fingertips and they will twist and fold over on one another.  It is very painful.  It does not happen every day but can happen any time of day (even in the night).  Her balance is a little "off" but there have been no falls.  She does have a hx of dysphagia and she was evaluated by GI and was told that it was reflux.  She states that it really is about the same as it was despite tx.  The biggest issue is a large production of 'phlegm.'  She does not choke on food, pills or saliva.  She reports some word finding troubles.   She otherwise does not think that speech has changed.  She has no diplopia.  She has no lateralizing weakness/paresthesias.  She has never had neuroimaging.     Neuroimaging has not previously been performed.    PREVIOUS MEDICATIONS: n/a  ALLERGIES:  No Known Allergies  CURRENT MEDICATIONS:  Current Outpatient Prescriptions on File Prior to Visit  Medication Sig Dispense Refill  . aspirin 81 MG tablet Take 81 mg by mouth daily.      Marland Kitchen esomeprazole (NEXIUM) 40 MG capsule Take 1 capsule (40 mg total) by mouth 2 (two) times daily.  60 capsule  2  . atenolol-chlorthalidone (TENORETIC 50) 50-25 MG per tablet Take 1 tablet by mouth daily.  100 tablet  3   No current facility-administered medications on file prior to visit.    PAST MEDICAL HISTORY:   Past  Medical History  Diagnosis Date  . GERD (gastroesophageal reflux disease)   . Obesity   . Hypertension   . Ventral hernia, unspecified, without mention of obstruction or gangrene   . Pulmonary hypertension   . Proctitis     PAST SURGICAL HISTORY:   Past Surgical History  Procedure Laterality Date  . Abdominal hysterectomy    . Tubal ligation    . Wrist surgery      ganglion cyst right  . Carpal tunnel release      right    SOCIAL HISTORY:   History   Social History  . Marital Status: Divorced    Spouse Name: N/A    Number of Children: 2  . Years of Education: N/A   Occupational History  . 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 Smoking status: Never Smoker   . Smokeless tobacco: Former Neurosurgeon    Types: Snuff     Comment: hx of snuff use until Nov 08, 2012  . Alcohol Use: No  . Drug Use: No  . Sexual Activity: Not on file   Other Topics Concern  . Not on file   Social History Narrative   Malen Gauze  mother    FAMILY HISTORY:   Family Status  Relation Status Death Age  . Mother Deceased     CAD  . Father Deceased     murder  . Sister Alive     2, healthy  . Child Alive     2, healthy    ROS:  A complete 10 system review of systems was obtained and was unremarkable apart from what is mentioned above.  PHYSICAL EXAMINATION:    VITALS:   Filed Vitals:   08/30/13 0923  BP: 126/76  Pulse: 74  Temp: 99.3 F (37.4 C)  Height: 6\' 2"  (1.88 m)  Weight: 318 lb 4.8 oz (144.38 kg)    GEN:  Normal appears female in no acute distress.  Appears stated age. HEENT:  Normocephalic, atraumatic. The mucous membranes are moist. The superficial temporal arteries are without ropiness or tenderness. Cardiovascular: Regular rate and rhythm. Lungs: Clear to auscultation bilaterally. Neck/Heme: There are no carotid bruits noted bilaterally.  NEUROLOGICAL: Orientation:  The patient is alert and oriented x 3.  Fund of knowledge is appropriate.   Recent and remote memory intact.  Attention span and concentration normal.  Repeats and names without difficulty. Cranial nerves: There is good facial symmetry. The pupils are equal round and reactive to light bilaterally. Fundoscopic exam reveals clear disc margins bilaterally. Extraocular muscles are intact and visual fields are full to confrontational testing. Speech is fluent but has a pseudobulbar quality to it. Soft palate rises symmetrically and there is no tongue deviation. Hearing is intact to conversational tone.  No tongue fasciculations noted. Tone: Tone is good throughout. Sensation: Sensation is intact to light touch and pinprick throughout (facial, trunk, extremities). Vibration is intact at the bilateral big toe. There is no extinction with double simultaneous stimulation. There is no sensory dermatomal level identified. Coordination:  The patient has no trouble with FNF but she has some trouble with finger taps and toe/heel taps on the L.  The R was good. Motor: Strength is 5/5 in the bilateral upper and lower extremities.  Shoulder shrug is equal and symmetric. There is no pronator drift.  There are no fasciculations noted. DTR's: Deep tendon reflexes are 1/4 at the bilateral biceps, triceps, brachioradialis, patella and trace at the bilateral achilles.  Plantar responses are downgoing bilaterally. Gait and Station: The patient is able to ambulate without difficulty. She has mild decreased arm swing on the L.  LABS:  Lab Results  Component Value Date   WBC 4.5 02/08/2012   HGB 13.9 02/08/2012   HCT 41.8 02/08/2012   MCV 88.9 02/08/2012   PLT 194.0 02/08/2012   Lab Results  Component Value Date   TSH 1.73 02/08/2012     Chemistry      Component Value Date/Time   NA 142 02/08/2012 1412   K 4.1 02/08/2012 1412   CL 108 02/08/2012 1412   CO2 28 02/08/2012 1412   BUN 17 02/08/2012 1412   CREATININE 1.1 02/08/2012 1412      Component Value Date/Time   CALCIUM 8.8 02/08/2012 1412    ALKPHOS 50 02/08/2012 1412   AST 22 02/08/2012 1412   ALT 24 02/08/2012 1412   BILITOT 0.6 02/08/2012 1412        IMPRESSION/PLAN  65. 62 year old female with intermittent finger cramping and trouble writing.  She had pseudobulbar quality to speech but not sure if that is her normal speech and I asked her to ask her children if her speech had  changed.  She does not perceive that it has.  -She will have labs including CBC, CMP, CPK, aldolase, ESR, TSH, AchRAb, B12.  -She will have an MRI brain  -If negative, we need to consider EMG, +/- DaT scan.  She has trouble with RAM's on the L and cramping could indicate DA loss, but otherwise tone and balance were pretty good.  -She is to call me after the above are completed.

## 2013-09-01 LAB — ALDOLASE: Aldolase: 4.9 U/L

## 2013-09-06 LAB — MYASTHENIA GRAVIS PANEL 2
Acetylcholine Rec Binding: <0.3 nmol/L
Acetylcholine Rec Mod Ab: 16 %

## 2013-10-03 ENCOUNTER — Telehealth: Payer: Self-pay | Admitting: Neurology

## 2013-10-03 NOTE — Telephone Encounter (Signed)
Called and received report of MR brain from [facility]. Will have Dr Tat review tomorrow and call patient with results after.

## 2013-10-03 NOTE — Telephone Encounter (Signed)
Pt called, states she was in the office approx. 1 month ago and went for labs and a MRI. She has never heard back with results or next step in care. Please call [phone] / Sherri S.

## 2013-10-04 NOTE — Telephone Encounter (Signed)
Tell her I am sorry about that.  I had not seen results since at outside facility.  I did look today.  MRI was essentially unremarkable for age.  Next step would be EMG UE with Dr Posey Pronto.  Please schedule.

## 2013-10-04 NOTE — Telephone Encounter (Signed)
Called patient and let her know that her MR and labs look okay. Explained that an EMG is her next step. She is agreeable to having this done. She would like a Thursday appt at 1:00 pm. Sherri- Please call patient to set up.

## 2013-10-05 NOTE — Telephone Encounter (Signed)
Sherri, just forwarding to you to make you aware

## 2013-10-26 ENCOUNTER — Encounter: Payer: Self-pay | Admitting: Neurology

## 2013-11-07 ENCOUNTER — Encounter: Payer: BC Managed Care – PPO | Admitting: Neurology

## 2013-12-31 ENCOUNTER — Other Ambulatory Visit: Payer: Self-pay

## 2013-12-31 DIAGNOSIS — Z1231 Encounter for screening mammogram for malignant neoplasm of breast: Secondary | ICD-10-CM

## 2014-01-03 ENCOUNTER — Ambulatory Visit (INDEPENDENT_AMBULATORY_CARE_PROVIDER_SITE_OTHER): Payer: BC Managed Care – PPO | Admitting: Family Medicine

## 2014-01-03 ENCOUNTER — Encounter: Payer: Self-pay | Admitting: Family Medicine

## 2014-01-03 VITALS — BP 120/78 | Temp 98.3°F | Wt 312.0 lb

## 2014-01-03 DIAGNOSIS — I10 Essential (primary) hypertension: Secondary | ICD-10-CM

## 2014-01-03 DIAGNOSIS — R5383 Other fatigue: Secondary | ICD-10-CM

## 2014-01-03 DIAGNOSIS — R5381 Other malaise: Secondary | ICD-10-CM

## 2014-01-03 DIAGNOSIS — E669 Obesity, unspecified: Secondary | ICD-10-CM

## 2014-01-03 LAB — CBC WITH DIFFERENTIAL/PLATELET
Basophils Absolute: 0 10*3/uL (ref 0.0–0.1)
Basophils Relative: 0.3 % (ref 0.0–3.0)
EOS PCT: 2.1 % (ref 0.0–5.0)
Eosinophils Absolute: 0.1 10*3/uL (ref 0.0–0.7)
HEMATOCRIT: 42.3 % (ref 36.0–46.0)
Hemoglobin: 14.4 g/dL (ref 12.0–15.0)
LYMPHS PCT: 32 % (ref 12.0–46.0)
Lymphs Abs: 1.2 10*3/uL (ref 0.7–4.0)
MCHC: 34 g/dL (ref 30.0–36.0)
MCV: 87.1 fl (ref 78.0–100.0)
Monocytes Absolute: 0.3 10*3/uL (ref 0.1–1.0)
Monocytes Relative: 6.5 % (ref 3.0–12.0)
NEUTROS PCT: 59.1 % (ref 43.0–77.0)
Neutro Abs: 2.3 10*3/uL (ref 1.4–7.7)
PLATELETS: 218 10*3/uL (ref 150.0–400.0)
RBC: 4.85 Mil/uL (ref 3.87–5.11)
RDW: 13.4 % (ref 11.5–14.6)
WBC: 3.9 10*3/uL — AB (ref 4.5–10.5)

## 2014-01-03 LAB — BASIC METABOLIC PANEL
BUN: 20 mg/dL (ref 6–23)
CHLORIDE: 106 meq/L (ref 96–112)
CO2: 30 meq/L (ref 19–32)
Calcium: 9.2 mg/dL (ref 8.4–10.5)
Creatinine, Ser: 1.2 mg/dL (ref 0.4–1.2)
GFR: 57.61 mL/min — ABNORMAL LOW (ref >60.00)
Glucose, Bld: 107 mg/dL — ABNORMAL HIGH (ref 70–99)
POTASSIUM: 4.1 meq/L (ref 3.5–5.1)
SODIUM: 144 meq/L (ref 135–145)

## 2014-01-03 LAB — TSH: TSH: 1.39 u[IU]/mL (ref 0.35–5.50)

## 2014-01-03 MED ORDER — ATENOLOL 25 MG PO TABS: 25.0000 mg | ORAL_TABLET | Freq: Every day | ORAL | Status: DC

## 2014-01-03 NOTE — Patient Instructions (Signed)
Kiara Werner the information on the diagnosis of pulmonary hypertension  Tenormin 25 mg........ one tablet daily in the morning  Zyrtec 10 mg plain,,,,,,,,,,, one tablet at bedtime  I will call you when I get your lab work back

## 2014-01-03 NOTE — Progress Notes (Signed)
Pre visit review using our clinic review tool, if applicable. No additional management support is needed unless otherwise documented below in the visit note. 

## 2014-01-03 NOTE — Progress Notes (Signed)
   Subjective:    Patient ID: Kiara Werner, female    DOB: 02/01/1951, 63 y.o.   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 She still taken and Tenoretic a half a tablet a day.  She also has allergic rhinitis with head congestion runny nose and postnasal drip this been worse the past month or so.  She also tells me she was diagnosed of pulmonary hypertension. She went to a weight loss clinic and have it cardiac evaluation and was told by the doctor there that she had pulmonary hypertension. She has documentation of this at home. I asked her to send me that's I can review that.  She also has chronic reflux esophagitis with a hiatal hernia. She takes Nexium 40 mg daily weight is still elevated 312 pounds  Review of Systems    negative Objective:   Physical Exam   well-developed well-nourished female no acute distress vital signs stable she is afebrile except weight ......... 312 pounds. BP normal 120/78    Assessment & Plan:   Fatigue plan workup  Diagnoses of pulmonary hypertension ,,,,,,,,, get records appropriate frozen indicated  Allergic rhinitis ,,,,,,, Zyrtec 10 mg each bedtime  Hypertension ........Marland Kitchen plain Tenormin 25 mg daily

## 2014-01-11 ENCOUNTER — Ambulatory Visit
Admission: RE | Admit: 2014-01-11 | Discharge: 2014-01-11 | Disposition: A | Discharge status: Home / Self Care | Payer: BC Managed Care – PPO | Source: Ambulatory Visit

## 2014-01-11 DIAGNOSIS — Z1231 Encounter for screening mammogram for malignant neoplasm of breast: Secondary | ICD-10-CM

## 2014-01-29 ENCOUNTER — Encounter: Payer: Self-pay | Admitting: Family Medicine

## 2014-02-05 ENCOUNTER — Ambulatory Visit (INDEPENDENT_AMBULATORY_CARE_PROVIDER_SITE_OTHER): Payer: BC Managed Care – PPO | Admitting: Internal Medicine

## 2014-02-05 ENCOUNTER — Encounter: Payer: Self-pay | Admitting: Internal Medicine

## 2014-02-05 VITALS — BP 126/74 | HR 80 | Ht 74.0 in | Wt 316.6 lb

## 2014-02-05 DIAGNOSIS — K219 Gastro-esophageal reflux disease without esophagitis: Secondary | ICD-10-CM

## 2014-02-05 DIAGNOSIS — R1013 Epigastric pain: Secondary | ICD-10-CM

## 2014-02-05 DIAGNOSIS — K3189 Other diseases of stomach and duodenum: Secondary | ICD-10-CM

## 2014-02-05 MED ORDER — RANITIDINE HCL 300 MG PO TABS: 300.0000 mg | ORAL_TABLET | Freq: Every day | ORAL | Status: DC

## 2014-02-05 NOTE — Patient Instructions (Addendum)
We have sent the following medications to your pharmacy for you to pick up at your convenience: Ranitidine 300 mg every night (in addition to Nexium 40 mg every morning)  Please purchase the following medications over the counter and take as directed: Gaviscon-chew 1-2 tablets every 3-4 hours as needed for abdominal pain  Attempt a weight loss plan to aid in alleviating some of your symptoms.  You have been scheduled for an abdominal ultrasound, upper GI and barium esophagram at [facility] (1st floor of hospital) on 02/07/14 at 8:30 am. Please arrive 15 minutes prior to your appointment for registration. Make certain not to have anything to eat or drink after midnight on the night before your appointment. Should you need to reschedule your appointment, please contact radiology at [phone].   CC: Dr Sherren Mocha

## 2014-02-05 NOTE — Progress Notes (Signed)
Kiara Werner Jan 20, 1951 [id number]  Note: This dictation was prepared with Dragon digital system. Any transcriptional errors that result from this procedure are unintentional.   History of Present Illness:  This is a 63 year old African American female with continued epigastric discomfort which occurs during the day, and not at night, and  feels like pressure in her upper abdomen. We have seen her in the past for gastroesophageal reflux. She had an upper endoscopy in 2009 and again in March 2014 which showed grade 1 esophagitis. She stopped taking Aleve at our recommendation and was on Nexium 40 mg twice a day which she has since reduced to once a day. She sits at the computer all day long and the pain usually bothers her at that time. She denies any nocturnal pain. There is no family history of gallbladder disease. She has been trying to lose weight unsuccessfully. She exercises 3 times a week.    Past Medical History  Diagnosis Date  . GERD (gastroesophageal reflux disease)   . Obesity   . Hypertension   . Ventral hernia, unspecified, without mention of obstruction or gangrene   . Pulmonary hypertension   . Proctitis   . Gastritis     Past Surgical History  Procedure Laterality Date  . Abdominal hysterectomy    . Tubal ligation    . Wrist surgery      ganglion cyst right  . Carpal tunnel release      right    No Known Allergies  Family history and social history have been reviewed.  Review of Systems:   The remainder of the 10 point ROS is negative except as outlined in the H&P  Physical Exam: General Appearance Well developed, in no distress, overweight Eyes  Non icteric  HEENT  Non traumatic, normocephalic  Mouth No lesion, tongue papillated, no cheilosis Neck Supple without adenopathy, thyroid not enlarged, no carotid bruits, no JVD Lungs Clear to auscultation bilaterally, large pendulous breasts COR Normal S1, normal S2, regular rhythm, no murmur, quiet  precordium Abdomen obese protuberant abdomen which is tender in the midline and above the umbilicus. There is no rebound. Left and right upper quadrants are unremarkable. There is no CVA tenderness. Rectal not done Extremities  No pedal edema Skin No lesions Neurological Alert and oriented x 3 Psychological Normal mood and affect  Assessment and Plan:   Problem #1 Epigastric discomfort, worse with meals. A recent upper endoscopy showed mild esophagitis. She will continue Nexium 40 mg in the morning and will add ranitidine 300 mg at bedtime. I have also asked her to use Gaviscon 1-2 tablets every 3-4 hours as needed. We have discussed continued weight loss. She seems to be frustrated by the fact that she's not able to lose any weight. We will proceed with an upper abdominal ultrasound to rule out symptomatic gallbladder disease. And UGI series to assess for GERD.    [place] Dragon 02/05/2014

## 2014-02-07 ENCOUNTER — Ambulatory Visit (HOSPITAL_COMMUNITY)
Admission: RE | Admit: 2014-02-07 | Discharge: 2014-02-07 | Disposition: A | Discharge status: Home / Self Care | Payer: BC Managed Care – PPO | Source: Ambulatory Visit | Attending: Internal Medicine | Admitting: Internal Medicine

## 2014-02-07 DIAGNOSIS — K219 Gastro-esophageal reflux disease without esophagitis: Secondary | ICD-10-CM | POA: Insufficient documentation

## 2014-02-07 DIAGNOSIS — R1013 Epigastric pain: Secondary | ICD-10-CM | POA: Insufficient documentation

## 2014-02-07 DIAGNOSIS — N281 Cyst of kidney, acquired: Secondary | ICD-10-CM | POA: Insufficient documentation

## 2014-02-21 ENCOUNTER — Telehealth: Payer: Self-pay | Admitting: *Deleted

## 2014-02-21 DIAGNOSIS — K219 Gastro-esophageal reflux disease without esophagitis: Secondary | ICD-10-CM

## 2014-02-21 MED ORDER — ESOMEPRAZOLE MAGNESIUM 40 MG PO CPDR: 40.0000 mg | DELAYED_RELEASE_CAPSULE | Freq: Two times a day (BID) | ORAL | Status: DC

## 2014-02-21 NOTE — Telephone Encounter (Signed)
Patient requests refills on Nexium. The last time an rx was sent was 08-27-13 (#60 with 2 refills). I indidcated that patient should take rx daily not prn at the time last rx was sent. Patient was recently seen for upper GI issues. I will send refills to pharmacy.

## 2014-05-21 ENCOUNTER — Ambulatory Visit (INDEPENDENT_AMBULATORY_CARE_PROVIDER_SITE_OTHER): Payer: BC Managed Care – PPO | Admitting: Family Medicine

## 2014-05-21 ENCOUNTER — Encounter: Payer: Self-pay | Admitting: Family Medicine

## 2014-05-21 VITALS — BP 150/88 | Temp 98.1°F | Wt 318.0 lb

## 2014-05-21 DIAGNOSIS — I10 Essential (primary) hypertension: Secondary | ICD-10-CM

## 2014-05-21 MED ORDER — AMLODIPINE BESYLATE 2.5 MG PO TABS: 2.5000 mg | ORAL_TABLET | Freq: Every day | ORAL | Status: DC

## 2014-05-21 MED ORDER — HYDROCHLOROTHIAZIDE 12.5 MG PO TABS: 12.5000 mg | ORAL_TABLET | Freq: Every day | ORAL | Status: DC

## 2014-05-21 NOTE — Progress Notes (Signed)
Pre visit review using our clinic review tool, if applicable. No additional management support is needed unless otherwise documented below in the visit note. 

## 2014-05-21 NOTE — Patient Instructions (Addendum)
Norvasc 2.5 mg.........Marland Kitchen 1 daily in the morning  Hydrochlorothiazide 12.5 mg..........Marland Kitchen 1 daily in the morning  Continue the Tenormin 25 mg,,,,,,, 1 daily in the morning  Omron pump up digital blood pressure cuff  Check your blood pressure daily in the morning  Return in 4 weeks for followup with the data and the device  Walk 30 minutes daily

## 2014-05-21 NOTE — Progress Notes (Signed)
   Subjective:    Patient ID: Kiara Werner, female    DOB: 10-07-1950, 63 y.o.   MRN: [id number]  HPI Kiara Werner is a 64 year old female who comes in today for elevated blood pressure. She's been on Tenormin 25 mg daily and on the past couple weeks she's noticed she hasn't felt well so she was monitoring her blood pressure. He was running 160/90 at home. BP today 150/88   Review of Systems    review of systems negative except for weight still elevated 318 pounds Objective:   Physical Exam  Well-developed well-nourished female in acute distress vital signs stable she's afebrile BP 150/88 right arm sitting position      Assessment & Plan:  Hypertension,,,,,,,,,,,, not at goal,,,,, pulse 60,,,,,,, therefore don't increase the beta blocker,,,,,, add Norvasc,

## 2014-06-18 ENCOUNTER — Ambulatory Visit (INDEPENDENT_AMBULATORY_CARE_PROVIDER_SITE_OTHER): Payer: BC Managed Care – PPO | Admitting: Family Medicine

## 2014-06-18 ENCOUNTER — Encounter: Payer: Self-pay | Admitting: Family Medicine

## 2014-06-18 VITALS — BP 130/74 | Temp 98.2°F | Wt 317.0 lb

## 2014-06-18 DIAGNOSIS — I10 Essential (primary) hypertension: Secondary | ICD-10-CM

## 2014-06-18 LAB — BASIC METABOLIC PANEL
BUN: 21 mg/dL (ref 6–23)
CALCIUM: 9.1 mg/dL (ref 8.4–10.5)
CO2: 25 mEq/L (ref 19–32)
CREATININE: 1.4 mg/dL — AB (ref 0.4–1.2)
Chloride: 107 mEq/L (ref 96–112)
GFR: 50.75 mL/min — AB (ref >60.00)
Glucose, Bld: 94 mg/dL (ref 70–99)
Potassium: 4.3 mEq/L (ref 3.5–5.1)
SODIUM: 142 meq/L (ref 135–145)

## 2014-06-18 NOTE — Progress Notes (Signed)
Pre visit review using our clinic review tool, if applicable. No additional management support is needed unless otherwise documented below in the visit note. 

## 2014-06-18 NOTE — Progress Notes (Signed)
   Subjective:    Patient ID: Kiara Werner, female    DOB: 12-22-1950, 63 y.o.   MRN: [id number]  HPI Kiara Werner is a 62 year old female who comes in today for followup of hypertension  She was on a beta blocker Tenormin 25 mg and a diuretic HCTZ 12.5 mg daily. Her blood pressure was not at goal. We started Norvasc on a September 1 2.5 mg daily and she stopped the diuretic. She was having a lot of cramping. Despite being off the diuretic she still has arms and leg cramps.  Blood pressure goal 130/76   Review of Systems No side effects to medication    Objective:   Physical Exam  Well-developed well-nourished female no acute distress vital signs stable she's afebrile BP 130/74 right arm sitting position      Assessment & Plan:  Hypertension at goal continue current therapy check potassium because of persistent cramping

## 2014-06-18 NOTE — Patient Instructions (Signed)
Continue Norvasc 2.5 mg........ one tablet daily in the morning  Tenormin 25 mg........ one tablet daily in the morning  Hydrochlorothiazide 12.5 mg......... one tablet daily when necessary  Labs today,,,,,,,,,, we will call you the report

## 2014-06-21 ENCOUNTER — Other Ambulatory Visit: Payer: Self-pay | Admitting: Family Medicine

## 2014-06-21 DIAGNOSIS — R252 Cramp and spasm: Secondary | ICD-10-CM

## 2014-06-26 ENCOUNTER — Telehealth: Payer: Self-pay | Admitting: Neurology

## 2014-06-27 ENCOUNTER — Ambulatory Visit: Payer: BC Managed Care – PPO | Admitting: Neurology

## 2014-08-02 ENCOUNTER — Telehealth: Payer: Self-pay | Admitting: Neurology

## 2014-08-02 DIAGNOSIS — R252 Cramp and spasm: Secondary | ICD-10-CM

## 2014-08-02 NOTE — Telephone Encounter (Signed)
-----   Message from Cindra Eves sent at 08/02/2014 12:59 PM EST ----- Please enter EMG order. Thanks!! Sherri

## 2014-08-02 NOTE — Telephone Encounter (Signed)
Order entered

## 2014-08-07 ENCOUNTER — Encounter: Payer: BC Managed Care – PPO | Admitting: Neurology

## 2014-08-19 ENCOUNTER — Telehealth: Payer: Self-pay | Admitting: Internal Medicine

## 2014-08-19 DIAGNOSIS — K219 Gastro-esophageal reflux disease without esophagitis: Secondary | ICD-10-CM

## 2014-08-19 MED ORDER — ESOMEPRAZOLE MAGNESIUM 40 MG PO CPDR: 40.0000 mg | DELAYED_RELEASE_CAPSULE | Freq: Two times a day (BID) | ORAL | Status: DC

## 2014-08-19 NOTE — Telephone Encounter (Signed)
Left message for patient. She needs to take her Nexium EVERY day, not PRN. I have sent refills to her pharmacy.

## 2014-08-20 ENCOUNTER — Ambulatory Visit: Payer: BC Managed Care – PPO | Admitting: Family Medicine

## 2014-08-21 ENCOUNTER — Ambulatory Visit: Payer: BC Managed Care – PPO | Admitting: Family Medicine

## 2014-09-06 ENCOUNTER — Telehealth: Payer: Self-pay | Admitting: Neurology

## 2014-09-06 NOTE — Telephone Encounter (Signed)
Pt canceled her 60 ming EMG for 09/09/14. Pt did not give a reason. Pt did say that she will call back to r/s.

## 2014-09-09 ENCOUNTER — Encounter: Payer: BC Managed Care – PPO | Admitting: Neurology

## 2014-10-14 ENCOUNTER — Encounter: Payer: Self-pay | Admitting: Family Medicine

## 2014-10-14 ENCOUNTER — Ambulatory Visit (INDEPENDENT_AMBULATORY_CARE_PROVIDER_SITE_OTHER): Payer: BLUE CROSS/BLUE SHIELD | Admitting: Family Medicine

## 2014-10-14 VITALS — BP 120/80 | Temp 98.9°F | Wt 317.0 lb

## 2014-10-14 DIAGNOSIS — J069 Acute upper respiratory infection, unspecified: Secondary | ICD-10-CM | POA: Diagnosis not present

## 2014-10-14 MED ORDER — HYDROCODONE-HOMATROPINE 5-1.5 MG/5ML PO SYRP: 5.0000 mL | ORAL_SOLUTION | Freq: Three times a day (TID) | ORAL | Status: DC | PRN

## 2014-10-14 NOTE — Progress Notes (Signed)
   Subjective:    Patient ID: Kiara Werner, female    DOB: 1951/04/17, 64 y.o.   MRN: [id number]  HPI Kiara Werner is a 64 year old single female nonsmoker who comes in today with a cold for a week. She has had congestion runny nose and cough. No fever etc. etc.   Review of Systems    review of systems negative Objective:   Physical Exam  Well-developed well-nourished female no acute distress vital signs stable she's afebrile HEENT were negative neck was supple no adenopathy lungs are clear      Assessment & Plan:  Viral syndrome treat symptomatically....... explained antibiotics are not indicated

## 2014-10-14 NOTE — Patient Instructions (Signed)
Drink lots of water  Vaporizer  Afrin nasal spray..... One shot up each nostril at bedtime...Marland KitchenMarland KitchenMarland Kitchen 5 night limit  Hydromet......Marland Kitchen 1/2-1 teaspoon 3 times daily as needed for cough and cold

## 2014-10-14 NOTE — Progress Notes (Signed)
Pre visit review using our clinic review tool, if applicable. No additional management support is needed unless otherwise documented below in the visit note. 

## 2014-10-29 NOTE — Telephone Encounter (Signed)
error 

## 2014-12-26 ENCOUNTER — Other Ambulatory Visit: Payer: Self-pay

## 2014-12-26 DIAGNOSIS — Z1231 Encounter for screening mammogram for malignant neoplasm of breast: Secondary | ICD-10-CM

## 2015-01-13 ENCOUNTER — Other Ambulatory Visit: Payer: Self-pay | Admitting: Family Medicine

## 2015-01-14 ENCOUNTER — Ambulatory Visit
Admission: RE | Admit: 2015-01-14 | Discharge: 2015-01-14 | Disposition: A | Discharge status: Home / Self Care | Payer: BLUE CROSS/BLUE SHIELD | Source: Ambulatory Visit

## 2015-01-14 DIAGNOSIS — Z1231 Encounter for screening mammogram for malignant neoplasm of breast: Secondary | ICD-10-CM

## 2015-01-27 ENCOUNTER — Encounter: Payer: Self-pay | Admitting: Family Medicine

## 2015-01-27 ENCOUNTER — Ambulatory Visit (INDEPENDENT_AMBULATORY_CARE_PROVIDER_SITE_OTHER): Payer: BLUE CROSS/BLUE SHIELD | Admitting: Family Medicine

## 2015-01-27 VITALS — BP 132/78 | HR 72 | Temp 98.6°F | Wt 314.0 lb

## 2015-01-27 DIAGNOSIS — H66002 Acute suppurative otitis media without spontaneous rupture of ear drum, left ear: Secondary | ICD-10-CM

## 2015-01-27 DIAGNOSIS — J029 Acute pharyngitis, unspecified: Secondary | ICD-10-CM

## 2015-01-27 LAB — POCT RAPID STREP A (OFFICE): RAPID STREP A SCREEN: NEGATIVE

## 2015-01-27 MED ORDER — AMOXICILLIN 875 MG PO TABS: 875.0000 mg | ORAL_TABLET | Freq: Two times a day (BID) | ORAL | Status: AC

## 2015-01-27 NOTE — Patient Instructions (Signed)

## 2015-01-27 NOTE — Progress Notes (Signed)
Pre visit review using our clinic review tool, if applicable. No additional management support is needed unless otherwise documented below in the visit note. 

## 2015-01-27 NOTE — Progress Notes (Signed)
   Subjective:    Patient ID: Kiara Werner, female    DOB: 01/11/1951, 64 y.o.   MRN: [id number]  HPI One-week history of sore throat, cough, fatigue. She initially had some chills and subjective fever but none now. She has left earache for the past few days. No sick contacts. Denies any nausea, vomiting, or diarrhea. Ear pain is moderate. No drainage.  Past Medical History  Diagnosis Date  . GERD (gastroesophageal reflux disease)   . Obesity   . Hypertension   . Ventral hernia, unspecified, without mention of obstruction or gangrene   . Pulmonary hypertension   . Proctitis   . Gastritis    Past Surgical History  Procedure Laterality Date  . Abdominal hysterectomy    . Tubal ligation    . Wrist surgery      ganglion cyst right  . Carpal tunnel release      right    reports that she has never smoked. She has quit using smokeless tobacco. Her smokeless tobacco use included Snuff. She reports that she does not drink alcohol or use illicit drugs. family history includes Breast cancer in her cousin; Colon cancer in her maternal uncle; Colon polyps in her maternal uncle; Diabetes in her maternal aunt and paternal aunt; Heart disease in her mother; Kidney disease in her maternal aunt. No Known Allergies    Review of Systems  Constitutional: Negative for fever and chills.  HENT: Positive for congestion and ear pain.   Respiratory: Positive for cough.        Objective:   Physical Exam  Constitutional: She appears well-developed and well-nourished.  HENT:  Head: Normocephalic and atraumatic.  Mouth/Throat: Oropharynx is clear and moist.  Right eardrum reveals small effusion. Left is very erythematous, bulging, distorted landmarks  Neck: Neck supple.  Cardiovascular: Normal rate and regular rhythm.   Pulmonary/Chest: Effort normal and breath sounds normal. No respiratory distress. She has no wheezes. She has no rales.  Lymphadenopathy:    She has no cervical adenopathy.            Assessment & Plan:  Viral syndrome with acute probable suppurative left otitis media. Amoxicillin 825 mg twice daily for 10 days. Follow-up with primary in 2 weeks if symptoms not fully resolved. Rapid strep negative

## 2015-01-28 ENCOUNTER — Telehealth: Payer: Self-pay | Admitting: *Deleted

## 2015-01-28 DIAGNOSIS — K219 Gastro-esophageal reflux disease without esophagitis: Secondary | ICD-10-CM

## 2015-01-28 MED ORDER — ESOMEPRAZOLE MAGNESIUM 40 MG PO CPDR: 40.0000 mg | DELAYED_RELEASE_CAPSULE | Freq: Two times a day (BID) | ORAL | Status: DC

## 2015-01-28 NOTE — Telephone Encounter (Signed)
Sent Rx for esomeprazole (Cass), 40 mg, #60 with 2 refills to [facility], [place], [place].

## 2015-01-30 ENCOUNTER — Ambulatory Visit (INDEPENDENT_AMBULATORY_CARE_PROVIDER_SITE_OTHER): Payer: BLUE CROSS/BLUE SHIELD | Admitting: Family Medicine

## 2015-01-30 ENCOUNTER — Encounter: Payer: Self-pay | Admitting: Family Medicine

## 2015-01-30 VITALS — BP 124/78 | HR 63 | Temp 98.8°F | Wt 314.0 lb

## 2015-01-30 DIAGNOSIS — H66002 Acute suppurative otitis media without spontaneous rupture of ear drum, left ear: Secondary | ICD-10-CM

## 2015-01-30 MED ORDER — PREDNISONE 10 MG PO TABS: ORAL_TABLET | ORAL | Status: DC

## 2015-01-30 NOTE — Progress Notes (Signed)
   Subjective:    Patient ID: Kiara Werner, female    DOB: 02/18/1951, 64 y.o.   MRN: [id number]  HPI   Patient recently seen with upper respiratory symptoms. Evidence for acute left suppurative otitis. We started amoxicillin. Her cough is somewhat better. She also notes her sore throat is better. She's had some vertigo symptoms intermittently for the past couple days and also some decreased hearing of the left ear and still some left ear pain. No fevers or chills. No adverse side effects from antibiotic. Denies right ear symptoms. Concern because of scheduled flight next week.  Past Medical History  Diagnosis Date  . GERD (gastroesophageal reflux disease)   . Obesity   . Hypertension   . Ventral hernia, unspecified, without mention of obstruction or gangrene   . Pulmonary hypertension   . Proctitis   . Gastritis    Past Surgical History  Procedure Laterality Date  . Abdominal hysterectomy    . Tubal ligation    . Wrist surgery      ganglion cyst right  . Carpal tunnel release      right    reports that she has never smoked. She has quit using smokeless tobacco. Her smokeless tobacco use included Snuff. She reports that she does not drink alcohol or use illicit drugs. family history includes Breast cancer in her cousin; Colon cancer in her maternal uncle; Colon polyps in her maternal uncle; Diabetes in her maternal aunt and paternal aunt; Heart disease in her mother; Kidney disease in her maternal aunt. No Known Allergies    Review of Systems  Constitutional: Negative for fever and chills.  HENT: Positive for congestion and ear pain.   Respiratory: Negative for cough.   Neurological: Positive for dizziness. Negative for headaches.       Objective:   Physical Exam  Constitutional: She appears well-developed and well-nourished.  HENT:  Mouth/Throat: Oropharynx is clear and moist.  Right eardrum reveals only mild erythema along handle of malleus Left eardrum is  erythematous but less then 3 days ago. No visible perforation.  Neck: Neck supple.  Cardiovascular: Normal rate and regular rhythm.   Pulmonary/Chest: Effort normal and breath sounds normal. No respiratory distress. She has no wheezes. She has no rales.          Assessment & Plan:  Left supperative otitis media. By appearance, slightly better c/w 3 days ago. Finish out amoxicillin. Consider Afrin prior to flying if she has any nasal congestion. Add brief prednisone taper over the next week. Touch base with primary in one week if symptoms not improved

## 2015-01-30 NOTE — Progress Notes (Signed)
Pre visit review using our clinic review tool, if applicable. No additional management support is needed unless otherwise documented below in the visit note. 

## 2015-02-04 ENCOUNTER — Encounter (HOSPITAL_COMMUNITY): Payer: Self-pay | Admitting: Emergency Medicine

## 2015-02-04 ENCOUNTER — Emergency Department (INDEPENDENT_AMBULATORY_CARE_PROVIDER_SITE_OTHER)
Admission: EM | Admit: 2015-02-04 | Discharge: 2015-02-04 | Disposition: A | Discharge status: Home / Self Care | Payer: BLUE CROSS/BLUE SHIELD | Source: Home / Self Care | Attending: Family Medicine | Admitting: Family Medicine

## 2015-02-04 DIAGNOSIS — H6692 Otitis media, unspecified, left ear: Secondary | ICD-10-CM | POA: Diagnosis not present

## 2015-02-04 DIAGNOSIS — H66001 Acute suppurative otitis media without spontaneous rupture of ear drum, right ear: Secondary | ICD-10-CM | POA: Diagnosis not present

## 2015-02-04 DIAGNOSIS — H6983 Other specified disorders of Eustachian tube, bilateral: Secondary | ICD-10-CM

## 2015-02-04 DIAGNOSIS — R0982 Postnasal drip: Secondary | ICD-10-CM | POA: Diagnosis not present

## 2015-02-04 DIAGNOSIS — T700XXD Otitic barotrauma, subsequent encounter: Secondary | ICD-10-CM

## 2015-02-04 MED ORDER — CEFUROXIME AXETIL 250 MG PO TABS: 250.0000 mg | ORAL_TABLET | Freq: Two times a day (BID) | ORAL | Status: DC

## 2015-02-04 NOTE — Discharge Instructions (Signed)
Barotitis Media Sudafed PE 10 mg every 4-6 hours for congestion Allegra or Claritin for drainage Afrin nasal spray before flight, might help Continue Prednisone as directed. Ceftin 250 mg  It may take a few weeks for the ears to clear of fluid. Barotitis media is inflammation of your middle ear. This occurs when the auditory tube (eustachian tube) leading from the back of your nose (nasopharynx) to your eardrum is blocked. This blockage may result from a cold, environmental allergies, or an upper respiratory infection. Unresolved barotitis media may lead to damage or hearing loss (barotrauma), which may become permanent. HOME CARE INSTRUCTIONS   Use medicines as recommended by your health care provider. Over-the-counter medicines will help unblock the canal and can help during times of air travel.  Do not put anything into your ears to clean or unplug them. Eardrops will not be helpful.  Do not swim, dive, or fly until your health care provider says it is all right to do so. If these activities are necessary, chewing gum with frequent, forceful swallowing may help. It is also helpful to hold your nose and gently blow to pop your ears for equalizing pressure changes. This forces air into the eustachian tube.  Only take over-the-counter or prescription medicines for pain, discomfort, or fever as directed by your health care provider.  A decongestant may be helpful in decongesting the middle ear and make pressure equalization easier. SEEK MEDICAL CARE IF:  You experience a serious form of dizziness in which you feel as if the room is spinning and you feel nauseated (vertigo).  Your symptoms only involve one ear. SEEK IMMEDIATE MEDICAL CARE IF:   You develop a severe headache, dizziness, or severe ear pain.  You have bloody or pus-like drainage from your ears.  You develop a fever.  Your problems do not improve or become worse. MAKE SURE YOU:   Understand these instructions.  Will  watch your condition.  Will get help right away if you are not doing well or get worse. Document Released: 09/03/2000 Document Revised: 06/27/2013 Document Reviewed: 04/03/2013 [facility] Patient Information 2015 [place], [place]. This information is not intended to replace advice given to you by your health care provider. Make sure you discuss any questions you have with your health care provider.  Otitis Media Otitis media is redness, soreness, and inflammation of the middle ear. Otitis media may be caused by allergies or, most commonly, by infection. Often it occurs as a complication of the common cold. SIGNS AND SYMPTOMS Symptoms of otitis media may include:  Earache.  Fever.  Ringing in your ear.  Headache.  Leakage of fluid from the ear. DIAGNOSIS To diagnose otitis media, your health care provider will examine your ear with an otoscope. This is an instrument that allows your health care provider to see into your ear in order to examine your eardrum. Your health care provider also will ask you questions about your symptoms. TREATMENT  Typically, otitis media resolves on its own within 3-5 days. Your health care provider may prescribe medicine to ease your symptoms of pain. If otitis media does not resolve within 5 days or is recurrent, your health care provider may prescribe antibiotic medicines if he or she suspects that a bacterial infection is the cause. HOME CARE INSTRUCTIONS   If you were prescribed an antibiotic medicine, finish it all even if you start to feel better.  Take medicines only as directed by your health care provider.  Keep all follow-up visits as directed  by your health care provider. SEEK MEDICAL CARE IF:  You have otitis media only in one ear, or bleeding from your nose, or both.  You notice a lump on your neck.  You are not getting better in 3-5 days.  You feel worse instead of better. SEEK IMMEDIATE MEDICAL CARE IF:   You have pain that is not  controlled with medicine.  You have swelling, redness, or pain around your ear or stiffness in your neck.  You notice that part of your face is paralyzed.  You notice that the bone behind your ear (mastoid) is tender when you touch it. MAKE SURE YOU:   Understand these instructions.  Will watch your condition.  Will get help right away if you are not doing well or get worse. Document Released: 06/11/2004 Document Revised: 01/21/2014 Document Reviewed: 04/03/2013 [facility] Patient Information 2015 [place], [place]. This information is not intended to replace advice given to you by your health care provider. Make sure you discuss any questions you have with your health care provider.  Sinusitis Sinusitis is redness, soreness, and puffiness (inflammation) of the air pockets in the bones of your face (sinuses). The redness, soreness, and puffiness can cause air and mucus to get trapped in your sinuses. This can allow germs to grow and cause an infection.  HOME CARE   Drink enough fluids to keep your pee (urine) clear or pale yellow.  Use a humidifier in your home.  Run a hot shower to create steam in the bathroom. Sit in the bathroom with the door closed. Breathe in the steam 3-4 times a day.  Put a warm, moist washcloth on your face 3-4 times a day, or as told by your doctor.  Use salt water sprays (saline sprays) to wet the thick fluid in your nose. This can help the sinuses drain.  Only take medicine as told by your doctor. GET HELP RIGHT AWAY IF:   Your pain gets worse.  You have very bad headaches.  You are sick to your stomach (nauseous).  You throw up (vomit).  You are very sleepy (drowsy) all the time.  Your face is puffy (swollen).  Your vision changes.  You have a stiff neck.  You have trouble breathing. MAKE SURE YOU:   Understand these instructions.  Will watch your condition.  Will get help right away if you are not doing well or get worse. Document  Released: 02/23/2008 Document Revised: 05/31/2012 Document Reviewed: 04/11/2012 [facility] Patient Information 2015 [place], [place]. This information is not intended to replace advice given to you by your health care provider. Make sure you discuss any questions you have with your health care provider.

## 2015-02-04 NOTE — ED Notes (Signed)
C/o  Sore throat and bilateral ear pain.  On set two weeks ago.  Pt states that she is being treated with amoxicillin and prednisone but having no relief.  Symptoms have become progressively worse.

## 2015-02-04 NOTE — ED Provider Notes (Signed)
CSN: [id number]     Arrival date & time 02/04/15  1730 History   First MD Initiated Contact with Patient 02/04/15 2014     Chief Complaint  Patient presents with  . Otalgia  . Sore Throat   (Consider location/radiation/quality/duration/timing/severity/associated sxs/prior Treatment) HPI Comments: This is the third visit to a medical provider for this 64 year old female complaining of bilateral earache, upper respiratory congestion, sore throat with decreased hearing. She was initially started on amoxicillin. After 3 day she developed a decreased hearing and dizziness and went back to her PCP. He then prescribed prednisone. She states that she now continues to have upper respiratory congestion, a fullness in her ear, ear pain despite the medication. Denies fever and recent days.   Past Medical History  Diagnosis Date  . GERD (gastroesophageal reflux disease)   . Obesity   . Hypertension   . Ventral hernia, unspecified, without mention of obstruction or gangrene   . Pulmonary hypertension   . Proctitis   . Gastritis    Past Surgical History  Procedure Laterality Date  . Abdominal hysterectomy    . Tubal ligation    . Wrist surgery      ganglion cyst right  . Carpal tunnel release      right   Family History  Problem Relation Age of Onset  . Breast cancer Cousin   . Colon cancer Maternal Uncle   . Colon polyps Maternal Uncle   . Diabetes Maternal Aunt   . Heart disease Mother   . Kidney disease Maternal Aunt   . Diabetes Paternal Aunt    History  Substance Use Topics  . Smoking status: Never Smoker   . Smokeless tobacco: Former Systems developer    Types: Snuff     Comment: hx of snuff use until Nov 08, 2012  . Alcohol Use: No   OB History    No data available     Review of Systems  Constitutional: Negative for fever, activity change and fatigue.  HENT: Positive for congestion, ear pain, hearing loss, rhinorrhea and sore throat. Negative for postnasal drip.   Eyes: Negative.    Respiratory: Negative for cough and chest tightness.   Cardiovascular: Negative for chest pain.  Gastrointestinal: Negative.   Neurological: Positive for dizziness.    Allergies  Review of patient's allergies indicates no known allergies.  Home Medications   Prior to Admission medications   Medication Sig Start Date End Date Taking? Authorizing Provider  amLODipine (NORVASC) 2.5 MG tablet Take 1 tablet (2.5 mg total) by mouth daily. 05/21/14  Yes Dorena Cookey, MD  amoxicillin (AMOXIL) 875 MG tablet Take 1 tablet (875 mg total) by mouth 2 (two) times daily. 01/27/15 02/06/15 Yes Eulas Post, MD  aspirin 81 MG tablet Take 81 mg by mouth daily.   Yes Historical Provider, MD  atenolol (TENORMIN) 25 MG tablet take 1 tablet by mouth once daily 01/13/15  Yes Dorena Cookey, MD  esomeprazole (NEXIUM) 40 MG capsule Take 1 capsule (40 mg total) by mouth 2 (two) times daily. 01/28/15  Yes [place] Dragon, MD  hydrochlorothiazide (HYDRODIURIL) 12.5 MG tablet Take 1 tablet (12.5 mg total) by mouth daily. 05/21/14  Yes Dorena Cookey, MD  predniSONE (DELTASONE) 10 MG tablet Taper as follows 4-4-4-3-3-2-2 01/30/15  Yes Eulas Post, MD  cefUROXime (CEFTIN) 250 MG tablet Take 1 tablet (250 mg total) by mouth 2 (two) times daily with a meal. 02/04/15   Janne Napoleon, NP   BP  162/89 mmHg  Pulse 60  Temp(Src) 97.7 F (36.5 C) (Oral)  Resp 20  SpO2 97% Physical Exam  Constitutional: She is oriented to person, place, and time. She appears well-developed and well-nourished. No distress.  HENT:  Mouth/Throat: No oropharyngeal exudate.  Left TM with generalized erythema and retraction. No apparent effusion. Right TM with partial erythema, retraction and mild bulging at 10:00.  Eyes: EOM are normal.  Neck: Normal range of motion. Neck supple.  Cardiovascular: Normal rate, regular rhythm and normal heart sounds.   Pulmonary/Chest: Breath sounds normal. No respiratory distress. She has no wheezes.   Lymphadenopathy:    She has no cervical adenopathy.  Neurological: She is alert and oriented to person, place, and time.  Skin: Skin is warm and dry.  Psychiatric: She has a normal mood and affect.  Nursing note and vitals reviewed.   ED Course  Procedures (including critical care time) Labs Review Labs Reviewed - No data to display  Imaging Review No results found.   MDM   1. PND (post-nasal drip)   2. Acute suppurative otitis media of right ear without spontaneous rupture of tympanic membrane, recurrence not specified   3. Otitis media follow-up, not resolved, left   4. Barotitis media, subsequent encounter   5. ETD (eustachian tube dysfunction), bilateral     Barotitis Media Sudafed PE 10 mg every 4-6 hours for congestion Allegra or Claritin for drainage Afrin nasal spray before flight, might help Continue Prednisone as directed. Ceftin 250 mg  It may take a few weeks for the ears to clear of fluid.    Janne Napoleon, NP 02/04/15 2040

## 2015-05-30 ENCOUNTER — Other Ambulatory Visit: Payer: Self-pay | Admitting: Family Medicine

## 2015-07-23 ENCOUNTER — Telehealth: Payer: Self-pay

## 2015-07-23 DIAGNOSIS — K219 Gastro-esophageal reflux disease without esophagitis: Secondary | ICD-10-CM

## 2015-07-23 NOTE — Telephone Encounter (Signed)
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 Incoming fax from [facility] for refills on Esomeprazole Magnesium 40mg  one twice a day. Pt was last seen in 01-2014. She is coming for a follow up on 09-25-2015. Pleasse advise on refills.

## 2015-07-23 NOTE — Telephone Encounter (Signed)
Ok to refill 

## 2015-07-24 MED ORDER — ESOMEPRAZOLE MAGNESIUM 40 MG PO CPDR: 40.0000 mg | DELAYED_RELEASE_CAPSULE | Freq: Two times a day (BID) | ORAL | Status: DC

## 2015-07-24 NOTE — Telephone Encounter (Signed)
Esomeprazole 40 mg BID was refilled.

## 2015-09-25 ENCOUNTER — Ambulatory Visit (INDEPENDENT_AMBULATORY_CARE_PROVIDER_SITE_OTHER): Payer: BLUE CROSS/BLUE SHIELD | Admitting: Gastroenterology

## 2015-09-25 ENCOUNTER — Encounter: Payer: Self-pay | Admitting: Gastroenterology

## 2015-09-25 VITALS — BP 140/84 | HR 68 | Ht 71.0 in | Wt 321.1 lb

## 2015-09-25 DIAGNOSIS — K219 Gastro-esophageal reflux disease without esophagitis: Secondary | ICD-10-CM | POA: Diagnosis not present

## 2015-09-25 MED ORDER — ESOMEPRAZOLE MAGNESIUM 40 MG PO CPDR: 40.0000 mg | DELAYED_RELEASE_CAPSULE | Freq: Two times a day (BID) | ORAL | Status: DC

## 2015-09-25 NOTE — Patient Instructions (Signed)
Follow up in a year We refilled your Nexium today

## 2015-10-13 NOTE — Progress Notes (Signed)
Kiara Werner    [id number]    [birth year]  Primary Care Physician:Kiara Werner Kiara Resides, MD  Referring Physician: Dorena Cookey, MD [place], [place] [address]  Chief complaint: GERD  HPI:  65 year old African American female with continued epigastric discomfort which occurs during the day, and not at night, and feels like pressure in her upper abdomen. We have seen her in the past for gastroesophageal reflux. She had an upper endoscopy in 2009 and again in March 2014 which showed grade 1 esophagitis. She stopped taking Aleve at our recommendation and was on Nexium 40 mg twice a day which she has since reduced to once a day. She sits at the computer all day long and the pain usually bothers her at that time. She denies any nocturnal pain. There is no family history of gallbladder disease. She has been trying to lose weight unsuccessfully.  Denies any nausea, vomiting, abdominal pain, melena or bright red blood per rectum   Outpatient Encounter Prescriptions as of 09/25/2015  Medication Sig  . aspirin 81 MG tablet Take 81 mg by mouth daily.  Marland Kitchen atenolol (TENORMIN) 25 MG tablet take 1 tablet by mouth once daily  . Cholecalciferol (VITAMIN D-3 PO) Take 1 tablet by mouth daily.  Marland Kitchen esomeprazole (NEXIUM) 40 MG capsule Take 1 capsule (40 mg total) by mouth 2 (two) times daily.  . hydrochlorothiazide (HYDRODIURIL) 12.5 MG tablet take 1 tablet by mouth once daily  . Multiple Vitamin (MULTIVITAMIN) tablet Take 1 tablet by mouth daily.  . [DISCONTINUED] esomeprazole (NEXIUM) 40 MG capsule Take 1 capsule (40 mg total) by mouth 2 (two) times daily.  . [DISCONTINUED] amLODipine (NORVASC) 2.5 MG tablet Take 1 tablet (2.5 mg total) by mouth daily.  . [DISCONTINUED] cefUROXime (CEFTIN) 250 MG tablet Take 1 tablet (250 mg total) by mouth 2 (two) times daily with a meal.  . [DISCONTINUED] predniSONE (DELTASONE) 10 MG tablet Taper as follows 4-4-4-3-3-2-2   No  facility-administered encounter medications on file as of 09/25/2015.    Allergies as of 09/25/2015  . (No Known Allergies)    Past Medical History  Diagnosis Date  . GERD (gastroesophageal reflux disease)   . Obesity   . Hypertension   . Ventral hernia, unspecified, without mention of obstruction or gangrene   . Pulmonary hypertension ([place])   . Proctitis   . Gastritis   . Bulging lumbar disc   . Arthritis   . Lumbar stenosis     Past Surgical History  Procedure Laterality Date  . Abdominal hysterectomy    . Tubal ligation    . Wrist surgery      ganglion cyst right  . Carpal tunnel release      right    Family History  Problem Relation Age of Onset  . Breast cancer Cousin   . Colon cancer Maternal Uncle   . Colon polyps Maternal Uncle   . Diabetes Maternal Aunt   . Heart disease Mother   . Kidney disease Maternal Aunt   . Diabetes Paternal Aunt     Social History   Social History  . Marital Status: Divorced    Spouse Name: N/A  . Number of Children: 2  . Years of Education: N/A   Occupational History  . 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 Smoking status: Never Smoker   . Smokeless tobacco: Former Systems developer  Types: Snuff     Comment: hx of snuff use until Nov 08, 2012  . Alcohol Use: No  . Drug Use: No  . Sexual Activity: Not on file   Other Topics Concern  . Not on file   Social History Narrative   Kiara Werner mother      Review of systems: Review of Systems  Constitutional: Negative for fever and chills.  HENT: Negative.   Eyes: Negative for blurred vision.  Respiratory: Negative for cough, shortness of breath and wheezing.   Cardiovascular: Negative for chest pain and palpitations.  Gastrointestinal: as per HPI Genitourinary: Negative for dysuria, urgency, frequency and hematuria.  Musculoskeletal: Negative for myalgias, back pain and joint pain.  Skin: Negative for itching and rash.  Neurological: Negative for  dizziness, tremors, focal weakness, seizures and loss of consciousness.  Endo/Heme/Allergies: Negative for environmental allergies.  Psychiatric/Behavioral: Negative for depression, suicidal ideas and hallucinations.  All other systems reviewed and are negative.   Physical Exam: Filed Vitals:   09/25/15 0833  BP: 140/84  Pulse: 68   Gen:      No acute distress HEENT:  EOMI, sclera anicteric Neck:     No masses; no thyromegaly Lungs:    Clear to auscultation bilaterally; normal respiratory effort CV:         Regular rate and rhythm; no murmurs Abd:      + bowel sounds; soft, non-tender; no palpable masses, no distension Ext:    No edema; adequate peripheral perfusion Skin:      Warm and dry; no rash Neuro: alert and oriented x 3 Psych: normal mood and affect  Data Reviewed:  As per HPI   Assessment and Plan/Recommendations:  65 year old female with obesity and GERD here for follow-up visit Her symptoms are well controlled on PPI once a day with only occasional breakthrough heartburn and epigastric pain Follow antireflux measures Discussed diet and excised for weight loss Continue Nexium daily Return 1 year  K. Denzil Magnuson , MD [phone] Mon-Fri 8a-5p [phone] after 5p, weekends, holidays

## 2015-10-21 ENCOUNTER — Encounter: Payer: Self-pay | Admitting: Gastroenterology

## 2015-10-22 ENCOUNTER — Encounter: Payer: Self-pay | Admitting: Gastroenterology

## 2015-10-22 ENCOUNTER — Ambulatory Visit (INDEPENDENT_AMBULATORY_CARE_PROVIDER_SITE_OTHER): Payer: BLUE CROSS/BLUE SHIELD | Admitting: Gastroenterology

## 2015-10-22 VITALS — BP 130/82 | HR 64 | Ht 71.25 in | Wt 318.8 lb

## 2015-10-22 DIAGNOSIS — R1013 Epigastric pain: Secondary | ICD-10-CM

## 2015-10-22 DIAGNOSIS — K219 Gastro-esophageal reflux disease without esophagitis: Secondary | ICD-10-CM | POA: Diagnosis not present

## 2015-10-22 MED ORDER — POLYETHYLENE GLYCOL 3350 17 GM/SCOOP PO POWD: ORAL | Status: DC

## 2015-10-22 NOTE — Patient Instructions (Signed)
You have been scheduled for a CT scan of the abdomen and pelvis at [facility] ([address]---this is in the same building as [facility]).   You are scheduled on 10/27/2015 at [facility] should arrive 15 minutes prior to your appointment time for registration. Please follow the written instructions below on the day of your exam:  WARNING: IF YOU ARE ALLERGIC TO IODINE/X-RAY DYE, PLEASE NOTIFY RADIOLOGY IMMEDIATELY AT [phone]! YOU WILL BE GIVEN A 13 HOUR PREMEDICATION PREP.  1) Do not eat or drink anything after 5am (4 hours prior to your test) 2) You have been given 2 bottles of oral contrast to drink. The solution may taste               better if refrigerated, but do NOT add ice or any other liquid to this solution. Shake             well before drinking.    Drink 1 bottle of contrast @ 7am (2 hours prior to your exam)  Drink 1 bottle of contrast @ 8am (1 hour prior to your exam)  You may take any medications as prescribed with a small amount of water except for the following: Metformin, Glucophage, Glucovance, Avandamet, Riomet, Fortamet, Actoplus Met, Janumet, Glumetza or Metaglip. The above medications must be held the day of the exam AND 48 hours after the exam.  The purpose of you drinking the oral contrast is to aid in the visualization of your intestinal tract. The contrast solution may cause some diarrhea. Before your exam is started, you will be given a small amount of fluid to drink. Depending on your individual set of symptoms, you may also receive an intravenous injection of x-ray contrast/dye. Plan on being at [facility] for 30 minutes or longer, depending on the type of exam you are having performed.  This test typically takes 30-45 minutes to complete.  If you have any questions regarding your exam or if you need to reschedule, you may call the CT department at [phone] between the hours of 8:00 am and 5:00 pm,  Monday-Friday.  ________________________________________________________________________

## 2015-10-22 NOTE — Progress Notes (Signed)
BARBETTE PASKO    [id number]    1951-03-08  Primary Care Physician:TODD,JEFFREY Kiara Resides, MD  Referring Physician: Dorena Cookey, MD [place], [place] [address]  Chief complaint:  Epigastric abdominal pain  HPI: 65 year old African American female here with complaint of worsening epigastric abdominal pain, feel its much worse in the past 1-2 weeks. No change with activity or food intake, she feels her acid reflux symptoms are well controlled.denies any nausea or vomiting associated with the pain. She is having some irregular bowel movements with constipation and moving her bowels every 2-3 days , currently not on any laxatives. She had an upper endoscopy in 2009 and again in March 2014 which showed grade 1 esophagitis. She continues to take Nexium 40 mg once a day. She had a normal abdominal ultrasound with no evidence of gallbladder disease and normal upper GI series in May 2015.   Outpatient Encounter Prescriptions as of 10/22/2015  Medication Sig  . aspirin 81 MG tablet Take 81 mg by mouth daily.  Marland Kitchen atenolol (TENORMIN) 25 MG tablet take 1 tablet by mouth once daily  . Cholecalciferol (VITAMIN D-3 PO) Take 1 tablet by mouth daily.  Marland Kitchen esomeprazole (NEXIUM) 40 MG capsule Take 1 capsule (40 mg total) by mouth 2 (two) times daily.  Marland Kitchen FLECTOR 1.3 % PTCH apply 1 patch to affected area once daily  . hydrochlorothiazide (HYDRODIURIL) 12.5 MG tablet take 1 tablet by mouth once daily  . Multiple Vitamin (MULTIVITAMIN) tablet Take 1 tablet by mouth daily.  . polyethylene glycol powder (GLYCOLAX/MIRALAX) powder Use 1/2 capful daily   No facility-administered encounter medications on file as of 10/22/2015.    Allergies as of 10/22/2015  . (No Known Allergies)    Past Medical History  Diagnosis Date  . GERD (gastroesophageal reflux disease)   . Obesity   . Hypertension   . Ventral hernia, unspecified, without mention of obstruction or gangrene   . Pulmonary  hypertension ([place])   . Proctitis   . Gastritis   . Bulging lumbar disc   . Arthritis   . Lumbar stenosis     Past Surgical History  Procedure Laterality Date  . Abdominal hysterectomy    . Tubal ligation    . Wrist surgery      ganglion cyst right  . Carpal tunnel release      right    Family History  Problem Relation Age of Onset  . Breast cancer Cousin   . Colon cancer Maternal Uncle   . Colon polyps Maternal Uncle   . Diabetes Maternal Aunt   . Heart disease Mother   . Kidney disease Maternal Aunt   . Diabetes Paternal Aunt     Social History   Social History  . Marital Status: Divorced    Spouse Name: N/A  . Number of Children: 2  . Years of Education: N/A   Occupational History  . 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 Smoking status: Never Smoker   . Smokeless tobacco: Former Systems developer    Types: Snuff     Comment: hx of snuff use until Nov 08, 2012  . Alcohol Use: No  . Drug Use: No  . Sexual Activity: Not on file   Other Topics Concern  . 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  HENT: Negative.   Eyes: Negative for blurred vision.  Respiratory: Negative for cough, shortness of breath and wheezing.   Cardiovascular: Negative for chest pain and palpitations.  Gastrointestinal: as per HPI Genitourinary: Negative for dysuria, urgency, frequency and hematuria.  Musculoskeletal: Negative for myalgias, back pain and joint pain.  Skin: Negative for itching and rash.  Neurological: Negative for dizziness, tremors, focal weakness, seizures and loss of consciousness.  Endo/Heme/Allergies: Negative for environmental allergies.  Psychiatric/Behavioral: Negative for depression, suicidal ideas and hallucinations.  All other systems reviewed and are negative.   Physical Exam: Filed Vitals:   10/22/15 1548  BP: 130/82    Pulse: 64   Gen:      No acute distress HEENT:  EOMI, sclera anicteric Neck:     No masses; no thyromegaly Lungs:    Clear to auscultation bilaterally; normal respiratory effort CV:         Regular rate and rhythm; no murmurs Abd:      + bowel sounds; soft, non-tender; no palpable masses, no distension Ext:    No edema; adequate peripheral perfusion Skin:      Warm and dry; no rash Neuro: alert and oriented x 3 Psych: normal mood and affect  Data Reviewed: Abdominal Ultrasound 01/2014 1. Normal gallbladder and normal caliber common bile duct. 2. Diffuse fatty infiltration of the liver with small scattered stable hepatic cysts. 3. Simple appearing upper pole right renal cyst.  Upper GI Series 01/2014 Normal upper GI. No explanation for patient's symptoms and no evidence of hiatal hernia.   Assessment and Plan/Recommendations:  65 year old female here with complaints of worsening of her chronic epigastric pain We'll schedule for CT abdomen and pelvis with contrast to further evaluated ? Fat pad necrosis/ventral hernia though not appreciated on physical exam If CT abdomen and pelvis is normal, likely functional or musculoskeletal pain GERD: Cont PPI and anti reflux measures Return in 2 months  K. Denzil Magnuson , MD [phone] Mon-Fri 8a-5p [phone] after 5p, weekends, holidays

## 2015-10-24 ENCOUNTER — Other Ambulatory Visit: Payer: Self-pay

## 2015-10-24 DIAGNOSIS — R1013 Epigastric pain: Secondary | ICD-10-CM

## 2015-10-24 DIAGNOSIS — K2100 Gastro-esophageal reflux disease with esophagitis, without bleeding: Secondary | ICD-10-CM

## 2015-10-27 ENCOUNTER — Ambulatory Visit (INDEPENDENT_AMBULATORY_CARE_PROVIDER_SITE_OTHER)
Admission: RE | Admit: 2015-10-27 | Discharge: 2015-10-27 | Disposition: A | Discharge status: Home / Self Care | Payer: BLUE CROSS/BLUE SHIELD | Source: Ambulatory Visit | Attending: Gastroenterology | Admitting: Gastroenterology

## 2015-10-27 DIAGNOSIS — R1013 Epigastric pain: Secondary | ICD-10-CM | POA: Diagnosis not present

## 2015-10-27 MED ORDER — IOHEXOL 300 MG/ML  SOLN
100.0000 mL | Freq: Once | INTRAMUSCULAR | Status: AC | PRN
  Administered 2015-10-27: 100 mL via INTRAVENOUS

## 2015-10-28 ENCOUNTER — Telehealth: Payer: Self-pay | Admitting: Gastroenterology

## 2015-10-29 NOTE — Telephone Encounter (Signed)
Please inform patient benign appearing liver cysts, will do MRI in 6 months to follow up. Thanks

## 2015-10-30 NOTE — Telephone Encounter (Signed)
-----   Message from Mauri Pole, MD sent at 10/29/2015  5:38 PM EST ----- Can you please let her know she has multiple very small liver cysts. And a renal cyst. Both likely benign incidental findings. Nothing to explain the abdominal pain. We can do follow up MRI abdomen in 6 months to reevaluate the liver cysts Thanks VN ----- Message -----    From: Greggory Keen, LPN    Sent: [id number]   3:50 PM      To: Mauri Pole, MD  She is calling about her CT. I think she has seen it on her "my chart" account.

## 2015-10-30 NOTE — Telephone Encounter (Signed)
Patient advised of the results and the plan for a follow up MRI in 6 months. She does consider herself to be claustrophobic, but did okay with an open MRI.

## 2015-10-31 ENCOUNTER — Telehealth: Payer: Self-pay | Admitting: Family Medicine

## 2015-10-31 MED ORDER — ATENOLOL 25 MG PO TABS: 25.0000 mg | ORAL_TABLET | Freq: Every day | ORAL | Status: DC

## 2015-10-31 NOTE — Telephone Encounter (Signed)
Pt request refill of the following: atenolol (TENORMIN) 25 MG tablet   Phamacy: [facility] [address]

## 2015-10-31 NOTE — Telephone Encounter (Signed)
rx sent

## 2015-12-03 ENCOUNTER — Other Ambulatory Visit: Payer: Self-pay | Admitting: Family Medicine

## 2015-12-15 ENCOUNTER — Encounter: Payer: Self-pay | Admitting: Internal Medicine

## 2016-02-14 ENCOUNTER — Other Ambulatory Visit: Payer: Self-pay | Admitting: Family Medicine

## 2016-02-20 ENCOUNTER — Encounter: Payer: Self-pay | Admitting: Adult Health

## 2016-02-20 ENCOUNTER — Ambulatory Visit (INDEPENDENT_AMBULATORY_CARE_PROVIDER_SITE_OTHER): Payer: BLUE CROSS/BLUE SHIELD | Admitting: Adult Health

## 2016-02-20 VITALS — BP 140/80 | Temp 98.2°F | Ht 71.25 in | Wt 324.0 lb

## 2016-02-20 DIAGNOSIS — I10 Essential (primary) hypertension: Secondary | ICD-10-CM

## 2016-02-20 MED ORDER — HYDROCHLOROTHIAZIDE 25 MG PO TABS: 25.0000 mg | ORAL_TABLET | Freq: Every day | ORAL | Status: AC

## 2016-02-20 MED ORDER — ATENOLOL 25 MG PO TABS: 25.0000 mg | ORAL_TABLET | Freq: Every day | ORAL | Status: AC

## 2016-02-20 NOTE — Patient Instructions (Signed)
It was great meeting you today!  Your labs from the [facility] look fine  I have sent in a prescriptions for your medications. As discussed I went up on the HCTZ to 25 mg so that we could bring down your blood pressure a little more.   Follow up with Dr. Sherren Mocha as needed

## 2016-02-20 NOTE — Progress Notes (Signed)
Subjective:    Patient ID: Kiara Werner, female    DOB: [birth year], 65 y.o.   MRN: [id number]  HPI  65 year old female, patient of Dr. Sherren Mocha who presents to the office today for follow up regarding hypertension.  She does not check her blood pressure at home. Reports that it has been in the "130=140's" She is currently on Atenolol 25 mg  and HCTZ 12.5 mg.   She denies any headaches, blurred vision, chest pain or shortness of breath.   She is trying to follow a low sodium diet and works out three days a week  Recently had her physical done at the [place]. ( These records scanned) A1c - 5.5  BMP  K-4.0 Cr. 1.2 BUN 19 GFR 54  CBC - WNL  She had a stress echo  in May 2017, which was negative  She denies any acute complaints.  Review of Systems  Constitutional: Negative.   Respiratory: Negative.   Cardiovascular: Negative.   Neurological: Negative.   Psychiatric/Behavioral: Negative.   All other systems reviewed and are negative.  Past Medical History  Diagnosis Date  . GERD (gastroesophageal reflux disease)   . Obesity   . Hypertension   . Ventral hernia, unspecified, without mention of obstruction or gangrene   . Pulmonary hypertension ([place])   . Proctitis   . Gastritis   . Bulging lumbar disc   . Arthritis   . Lumbar stenosis     Social History   Social History  . Marital Status: Divorced    Spouse Name: N/A  . Number of Children: 2  . Years of Education: N/A   Occupational History  . 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 Smoking status: Never Smoker   . Smokeless tobacco: Former Systems developer    Types: Snuff     Comment: hx of snuff use until Nov 08, 2012  . Alcohol Use: No  . Drug Use: No  . Sexual Activity: Not on file   Other Topics Concern  . 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 Abdominal hysterectomy    . Tubal ligation    . Wrist surgery       ganglion cyst right  . Carpal tunnel release      right    Family History  Problem Relation Age of Onset  . Breast cancer Cousin   . Colon cancer Maternal Uncle   . Colon polyps Maternal Uncle   . Diabetes Maternal Aunt   . Heart disease Mother   . Kidney disease Maternal Aunt   . Diabetes Paternal Aunt     No Known Allergies  Current Outpatient Prescriptions on File Prior to Visit  Medication Sig Dispense Refill  . aspirin 81 MG tablet Take 81 mg by mouth daily.    . Cholecalciferol (VITAMIN D-3 PO) Take 1 tablet by mouth daily.    Marland Kitchen esomeprazole (NEXIUM) 40 MG capsule Take 1 capsule (40 mg total) by mouth 2 (two) times daily. 60 capsule 11  . FLECTOR 1.3 % PTCH apply 1 patch to affected area once daily  0  . Multiple Vitamin (MULTIVITAMIN) tablet Take 1 tablet by mouth daily.    . polyethylene glycol powder (GLYCOLAX/MIRALAX) powder Use 1/2 capful daily 255 g 3   No current facility-administered medications on file prior to visit.  BP 140/80 mmHg  Temp(Src) 98.2 F (36.8 C) (Oral)  Ht 5' 11.25" (1.81 m)  Wt 324 lb (146.965 kg)  BMI 44.86 kg/m2        Objective:   Physical Exam  Constitutional: She is oriented to person, place, and time. She appears well-developed and well-nourished. No distress.  obese  Eyes: Conjunctivae and EOM are normal. Pupils are equal, round, and reactive to light. Right eye exhibits no discharge. Left eye exhibits no discharge.  Cardiovascular: Normal rate, regular rhythm, normal heart sounds and intact distal pulses.  Exam reveals no gallop and no friction rub.   No murmur heard. Pulmonary/Chest: Effort normal and breath sounds normal. No respiratory distress. She has no wheezes. She has no rales. She exhibits no tenderness.  Neurological: She is alert and oriented to person, place, and time.  Skin: Skin is warm and dry. No rash noted. She is not diaphoretic. No erythema. No pallor.  Psychiatric: She has a normal mood and affect. Her  behavior is normal. Judgment and thought content normal.  Nursing note and vitals reviewed.     Assessment & Plan:  1. Essential hypertension - hydrochlorothiazide (HYDRODIURIL) 25 MG tablet; Take 1 tablet (25 mg total) by mouth daily.  Dispense: 90 tablet; Refill: 3 - atenolol (TENORMIN) 25 MG tablet; Take 1 tablet (25 mg total) by mouth daily.  Dispense: 90 tablet; Refill: 3 - Results from [place] scanned - Follow up with PCP as needed  Dorothyann Peng, NP

## 2016-04-29 ENCOUNTER — Telehealth: Payer: Self-pay

## 2016-04-29 NOTE — Telephone Encounter (Signed)
I left her a message to call and discuss the scheduling of her open MRI to re-exam the liver cysts seen on her CT in February.

## 2016-04-29 NOTE — Telephone Encounter (Signed)
-----   Message from Greggory Keen, LPN sent at [id number]  9:50 AM EST ----- MRI (open) for re-evaluation of liver cysts and renal cyst based on the CT done 10/28/15

## 2016-05-03 ENCOUNTER — Other Ambulatory Visit: Payer: Self-pay

## 2016-05-03 DIAGNOSIS — Z01812 Encounter for preprocedural laboratory examination: Secondary | ICD-10-CM

## 2016-05-03 DIAGNOSIS — K7689 Other specified diseases of liver: Secondary | ICD-10-CM

## 2016-05-13 ENCOUNTER — Telehealth: Payer: Self-pay | Admitting: Gastroenterology

## 2016-05-13 NOTE — Telephone Encounter (Signed)
MRI done at [facility] on 05/06/16.

## 2016-05-13 NOTE — Telephone Encounter (Signed)
Patient had her follow up MRI of the liver to re-evaluate the cysts seen on CT. She had her MRI at [facility]. The report is in [place]. The radiologist did not have the CT scan to compare MRI images with. I had given [facility] the information, but it apparently did not get passed on. Do you want me to get [facility] radiologist to compare or [facility] radiologist to compare? The patient was given a disc of her MRI. She is calling for the results of the MRI.

## 2016-05-14 NOTE — Telephone Encounter (Signed)
Please inform patient that given the MRI was done outside [facility] system, the results were not sent to  inbox and we did not receive the fax either. Based on the report no significant change in the liver cysts but the report was limited and not very descriptive. We can try and see if one of her radiologist here will be able to repeat the images and compare with the CT scan she had here before

## 2016-05-14 NOTE — Telephone Encounter (Signed)
Patient is aware of the results.

## 2016-05-20 NOTE — Telephone Encounter (Signed)
Patient will take her MRI disc to [facility] for uploading. [place] CT will help to arrange a comparison.

## 2016-11-04 ENCOUNTER — Encounter: Payer: Self-pay | Admitting: Adult Health

## 2016-11-04 ENCOUNTER — Ambulatory Visit (INDEPENDENT_AMBULATORY_CARE_PROVIDER_SITE_OTHER): Payer: BLUE CROSS/BLUE SHIELD | Admitting: Adult Health

## 2016-11-04 VITALS — BP 124/72 | Temp 98.6°F | Ht 71.25 in | Wt 325.6 lb

## 2016-11-04 DIAGNOSIS — J0110 Acute frontal sinusitis, unspecified: Secondary | ICD-10-CM | POA: Diagnosis not present

## 2016-11-04 MED ORDER — DOXYCYCLINE HYCLATE 100 MG PO CAPS: 100.0000 mg | ORAL_CAPSULE | Freq: Two times a day (BID) | ORAL | 0 refills | Status: DC

## 2016-11-04 NOTE — Progress Notes (Signed)
Subjective:    Patient ID: Kiara Werner, female    DOB: 1951/04/30, 66 y.o.   MRN: [id number]  URI   This is a new problem. The current episode started 1 to 4 weeks ago. There has been no fever. Associated symptoms include congestion, rhinorrhea, sinus pain, a sore throat and swollen glands. Pertinent negatives include no chest pain, coughing, ear pain, headaches, nausea, plugged ear sensation or vomiting. She has tried decongestant for the symptoms. The treatment provided mild relief.   She is concerned because over the last 2-3 days she has started to have bright red blood in her sputum when she coughs it up in the morning. Denies any blood in sputum during the day.    Review of Systems  HENT: Positive for congestion, rhinorrhea, sinus pain and sore throat. Negative for ear pain.   Respiratory: Negative for cough.   Cardiovascular: Negative for chest pain.  Gastrointestinal: Negative for nausea and vomiting.  Neurological: Negative for headaches.   Past Medical History:  Diagnosis Date  . Arthritis   . Bulging lumbar disc   . Gastritis   . GERD (gastroesophageal reflux disease)   . Hypertension   . Lumbar stenosis   . Obesity   . Proctitis   . Pulmonary hypertension   . Ventral hernia, unspecified, without mention of obstruction or gangrene     Social History   Social History  . Marital status: Divorced    Spouse name: N/A  . Number of children: 2  . Years of education: N/A   Occupational History  . a/a law firm   . LEGAL SECRETARY [employer]   Social History Main Topics  . Smoking status: Never Smoker  . Smokeless tobacco: Former Systems developer    Types: Snuff     Comment: hx of snuff use until Nov 08, 2012  . Alcohol use No  . Drug use: No  . Sexual activity: Not on file   Other Topics Concern  . 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 ABDOMINAL HYSTERECTOMY    . CARPAL TUNNEL  RELEASE     right  . TUBAL LIGATION    . WRIST SURGERY     ganglion cyst right    Family History  Problem Relation Age of Onset  . Breast cancer Cousin   . Colon cancer Maternal Uncle   . Colon polyps Maternal Uncle   . Diabetes Maternal Aunt   . Heart disease Mother   . Kidney disease Maternal Aunt   . Diabetes Paternal Aunt     No Known Allergies  Current Outpatient Prescriptions on File Prior to Visit  Medication Sig Dispense Refill  . aspirin 81 MG tablet Take 81 mg by mouth daily.    Marland Kitchen atenolol (TENORMIN) 25 MG tablet Take 1 tablet (25 mg total) by mouth daily. 90 tablet 3  . Cholecalciferol (VITAMIN D-3 PO) Take 1 tablet by mouth daily.    Marland Kitchen esomeprazole (NEXIUM) 40 MG capsule Take 1 capsule (40 mg total) by mouth 2 (two) times daily. 60 capsule 11  . FLECTOR 1.3 % PTCH apply 1 patch to affected area once daily  0  . hydrochlorothiazide (HYDRODIURIL) 25 MG tablet Take 1 tablet (25 mg total) by mouth daily. 90 tablet 3  . Multiple Vitamin (MULTIVITAMIN) tablet Take 1 tablet by mouth daily.    . polyethylene glycol powder (GLYCOLAX/MIRALAX) powder Use  1/2 capful daily 255 g 3   No current facility-administered medications on file prior to visit.     BP 124/72   Temp 98.6 F (37 C) (Oral)   Ht 5' 11.25" (1.81 m)   Wt (!) 325 lb 9.6 oz (147.7 kg)   BMI 45.09 kg/m       Objective:   Physical Exam  Constitutional: She is oriented to person, place, and time. She appears well-developed and well-nourished. No distress.  HENT:  Head: Normocephalic and atraumatic.  Right Ear: Hearing, tympanic membrane, external ear and ear canal normal.  Left Ear: Hearing, tympanic membrane, external ear and ear canal normal.  Nose: Mucosal edema and rhinorrhea present. Epistaxis is observed. Right sinus exhibits frontal sinus tenderness.  Mouth/Throat: Uvula is midline, oropharynx is clear and moist and mucous membranes are normal. No oropharyngeal exudate.  Eyes: Conjunctivae and  EOM are normal. Pupils are equal, round, and reactive to light. Right eye exhibits no discharge. Left eye exhibits no discharge. No scleral icterus.  Cardiovascular: Normal rate, regular rhythm, normal heart sounds and intact distal pulses.  Exam reveals no gallop.   No murmur heard. Pulmonary/Chest: Effort normal and breath sounds normal. No respiratory distress. She has no wheezes. She has no rales. She exhibits no tenderness.  Lymphadenopathy:    She has cervical adenopathy.  Neurological: She is alert and oriented to person, place, and time.  Skin: Skin is warm and dry. No rash noted. She is not diaphoretic. No erythema. No pallor.  Psychiatric: She has a normal mood and affect. Her behavior is normal. Judgment and thought content normal.  Nursing note and vitals reviewed.     Assessment & Plan:  1. Acute non-recurrent frontal sinusitis - doxycycline (VIBRAMYCIN) 100 MG capsule; Take 1 capsule (100 mg total) by mouth 2 (two) times daily.  Dispense: 14 capsule; Refill: 0 - Bloody sputum likely from small crack in left nare. Advised to use normal saline nasal spray  - Follow up if no improvement   Dorothyann Peng, NP

## 2016-11-12 ENCOUNTER — Ambulatory Visit (INDEPENDENT_AMBULATORY_CARE_PROVIDER_SITE_OTHER): Payer: BLUE CROSS/BLUE SHIELD | Admitting: Family Medicine

## 2016-11-12 ENCOUNTER — Encounter: Payer: Self-pay | Admitting: Family Medicine

## 2016-11-12 ENCOUNTER — Other Ambulatory Visit: Payer: Self-pay

## 2016-11-12 ENCOUNTER — Telehealth: Payer: Self-pay | Admitting: Family Medicine

## 2016-11-12 VITALS — BP 116/70 | HR 68 | Temp 100.1°F | Wt 325.4 lb

## 2016-11-12 DIAGNOSIS — J029 Acute pharyngitis, unspecified: Secondary | ICD-10-CM

## 2016-11-12 DIAGNOSIS — J069 Acute upper respiratory infection, unspecified: Secondary | ICD-10-CM

## 2016-11-12 LAB — POCT RAPID STREP A (OFFICE): Rapid Strep A Screen: NEGATIVE

## 2016-11-12 MED ORDER — FEXOFENADINE HCL 60 MG PO TABS: 60.0000 mg | ORAL_TABLET | Freq: Every day | ORAL | 0 refills | Status: DC

## 2016-11-12 MED ORDER — FEXOFENADINE HCL 180 MG PO TABS: 180.0000 mg | ORAL_TABLET | Freq: Every day | ORAL | 0 refills | Status: DC

## 2016-11-12 NOTE — Progress Notes (Signed)
Patient ID: Kiara Werner, female   DOB: 15-Aug-1951, 66 y.o.   MRN: [id number]  PCP: Joycelyn Man, MD  Subjective:  Kiara Werner is a 66 y.o. year old very pleasant female patient who presents with Upper Respiratory infection symptoms including sinus pressure, nasal congestion, sore throat, post nasal drip,  Headache, and cough that is productive with white sputum. -started: 2 days ago symptoms worsening -previous treatments: Aleve which has provided moderate benefit. -sick contacts/travel/risks: Work sick contacts and she is unsure of  flu exposure.  Recently completed doxycycline one day ago for sinusitis diagnosed 11/04/16. Influenza vaccine is UTD  ROS-denies , SOB, NVD, ear pain,tooth pain  Pertinent Past Medical History- HTN, and GERD  Medications- reviewed  Current Outpatient Prescriptions  Medication Sig Dispense Refill  . aspirin 81 MG tablet Take 81 mg by mouth daily.    Marland Kitchen atenolol (TENORMIN) 25 MG tablet Take 1 tablet (25 mg total) by mouth daily. 90 tablet 3  . Cholecalciferol (VITAMIN D-3 PO) Take 1 tablet by mouth daily.    Marland Kitchen esomeprazole (NEXIUM) 40 MG capsule Take 1 capsule (40 mg total) by mouth 2 (two) times daily. 60 capsule 11  . FLECTOR 1.3 % PTCH apply 1 patch to affected area once daily  0  . hydrochlorothiazide (HYDRODIURIL) 25 MG tablet Take 1 tablet (25 mg total) by mouth daily. 90 tablet 3  . Multiple Vitamin (MULTIVITAMIN) tablet Take 1 tablet by mouth daily.    . polyethylene glycol powder (GLYCOLAX/MIRALAX) powder Use 1/2 capful daily 255 g 3   No current facility-administered medications for this visit.     Objective: BP 116/70 (BP Location: Left Arm, Patient Position: Sitting, Cuff Size: Normal)   Pulse 68   Temp 100.1 F (37.8 C) (Oral)   Wt (!) 325 lb 6.4 oz (147.6 kg)   SpO2 96%   BMI 45.07 kg/m  Gen: NAD, resting comfortably HEENT: Turbinates erythematous, TM normal, pharynx mildly erythematous with no tonsilar exudate or  edema, no sinus tenderness CV: RRR no murmurs rubs or gallops Lungs: CTAB no crackles, wheeze, rhonchi Abdomen: soft/nontender/nondistended/normal bowel sounds. No rebound or guarding.  Ext: no edema Skin: warm, dry, no rash Neuro: grossly normal, moves all extremities  Assessment/Plan:  Upper Respiratory infection History and exam today are suggestive of viral infection most likely due to upper respiratory infection. Symptomatic treatment with: Allegra short term. She creatinine was 1.4 and BUN 21. Short term dose for will be provided for 2 weeks. We discussed that this medication for long term use would need to have blood work to check kidney function.  She has lab orders in for this by her provider. Just completed doxycycline one day ago. Rapid Strep and influenza negative;  We discussed that we did not find any infection that had higher probability of being bacterial such as pneumonia or strep throat. We discussed signs that bacterial infection may have developed particularly fever >101, or shortness of breath. Likely course of 1-2 weeks. Patient is contagious and advised good handwashing and consideration of mask If going to be in public places.   Also advised her that if symptoms do not improve with supportive treatment, will consider a second antibiotic. She agreed to monitoring symptoms and will contact office for follow up as discussed. Finally, we reviewed reasons to return to care including if symptoms worsen or persist or new concerns arise- once again particularly shortness of breath or fever.   Laurita Quint, FNP

## 2016-11-12 NOTE — Telephone Encounter (Signed)
The pharmacy states they do not have the Rx fexofenadine (ALLEGRA) 60 MG tablet  They have not seen 60 mg in a while, warehouse does not have and no store within a 20 mi radius has this either.  They can order, but will not be available until Monday. Would you like to change the Rx?

## 2016-11-12 NOTE — Patient Instructions (Addendum)
Please take medication as directed. Your symptoms are most likely related to a viral illness. Please drink plenty of water so that your urine is pale yellow or clear. Also, get plenty of rest, use tylenol as needed for discomfort and follow up if symptoms do not improve in 3 to 4 days, worsen, or you develop a fever >101.   Upper Respiratory Infection, Adult Most upper respiratory infections (URIs) are caused by a virus. A URI affects the nose, throat, and upper air passages. The most common type of URI is often called "the common cold." Follow these instructions at home:  Take medicines only as told by your doctor.  Gargle warm saltwater or take cough drops to comfort your throat as told by your doctor.  Use a warm mist humidifier or inhale steam from a shower to increase air moisture. This may make it easier to breathe.  Drink enough fluid to keep your pee (urine) clear or pale yellow.  Eat soups and other clear broths.  Have a healthy diet.  Rest as needed.  Go back to work when your fever is gone or your doctor says it is okay.  You may need to stay home longer to avoid giving your URI to others.  You can also wear a face mask and wash your hands often to prevent spread of the virus.  Use your inhaler more if you have asthma.  Do not use any tobacco products, including cigarettes, chewing tobacco, or electronic cigarettes. If you need help quitting, ask your doctor. Contact a doctor if:  You are getting worse, not better.  Your symptoms are not helped by medicine.  You have chills.  You are getting more short of breath.  You have brown or red mucus.  You have yellow or brown discharge from your nose.  You have pain in your face, especially when you bend forward.  You have a fever.  You have puffy (swollen) neck glands.  You have pain while swallowing.  You have white areas in the back of your throat. Get help right away if:  You have very bad or  constant:  Headache.  Ear pain.  Pain in your forehead, behind your eyes, and over your cheekbones (sinus pain).  Chest pain.  You have long-lasting (chronic) lung disease and any of the following:  Wheezing.  Long-lasting cough.  Coughing up blood.  A change in your usual mucus.  You have a stiff neck.  You have changes in your:  Vision.  Hearing.  Thinking.  Mood. This information is not intended to replace advice given to you by your health care provider. Make sure you discuss any questions you have with your health care provider. Document Released: 02/23/2008 Document Revised: 05/09/2016 Document Reviewed: 12/12/2013 Elsevier Interactive Patient Education  2017 [employer].

## 2016-11-12 NOTE — Progress Notes (Signed)
Pre visit review using our clinic review tool, if applicable. No additional management support is needed unless otherwise documented below in the visit note. 

## 2016-11-12 NOTE — Progress Notes (Signed)
l °

## 2016-11-15 NOTE — Progress Notes (Signed)
Pre visit review using our clinic review tool, if applicable. No additional management support is needed unless otherwise documented below in the visit note. 

## 2016-11-15 NOTE — Telephone Encounter (Signed)
New Rx was sent to pharmacy. Patient was made aware.

## 2016-11-17 ENCOUNTER — Other Ambulatory Visit: Payer: Self-pay | Admitting: Family Medicine

## 2016-11-17 DIAGNOSIS — Z1231 Encounter for screening mammogram for malignant neoplasm of breast: Secondary | ICD-10-CM

## 2016-12-14 ENCOUNTER — Ambulatory Visit: Payer: BLUE CROSS/BLUE SHIELD

## 2017-01-03 ENCOUNTER — Ambulatory Visit
Admission: RE | Admit: 2017-01-03 | Discharge: 2017-01-03 | Disposition: A | Discharge status: Home / Self Care | Payer: BLUE CROSS/BLUE SHIELD | Source: Ambulatory Visit | Attending: Family Medicine | Admitting: Family Medicine

## 2017-01-03 DIAGNOSIS — Z1231 Encounter for screening mammogram for malignant neoplasm of breast: Secondary | ICD-10-CM

## 2017-11-28 ENCOUNTER — Other Ambulatory Visit: Payer: Self-pay | Admitting: Adult Health

## 2017-11-28 DIAGNOSIS — Z1231 Encounter for screening mammogram for malignant neoplasm of breast: Secondary | ICD-10-CM

## 2018-01-05 ENCOUNTER — Ambulatory Visit
Admission: RE | Admit: 2018-01-05 | Discharge: 2018-01-05 | Disposition: A | Discharge status: Home / Self Care | Payer: Medicare Other | Source: Ambulatory Visit | Attending: Adult Health | Admitting: Adult Health

## 2018-01-05 DIAGNOSIS — Z1231 Encounter for screening mammogram for malignant neoplasm of breast: Secondary | ICD-10-CM | POA: Diagnosis not present

## 2018-01-11 ENCOUNTER — Ambulatory Visit (INDEPENDENT_AMBULATORY_CARE_PROVIDER_SITE_OTHER): Payer: Medicare Other

## 2018-01-11 ENCOUNTER — Encounter: Payer: Self-pay | Admitting: Podiatry

## 2018-01-11 ENCOUNTER — Ambulatory Visit (INDEPENDENT_AMBULATORY_CARE_PROVIDER_SITE_OTHER): Payer: Medicare Other | Admitting: Podiatry

## 2018-01-11 DIAGNOSIS — M722 Plantar fascial fibromatosis: Secondary | ICD-10-CM | POA: Diagnosis not present

## 2018-01-12 ENCOUNTER — Telehealth: Payer: Self-pay | Admitting: *Deleted

## 2018-01-12 NOTE — Telephone Encounter (Signed)
I told pt I was reviewing her clinicals and did not see that he had order a medication for her, I would send him a message to order for her.

## 2018-01-12 NOTE — Telephone Encounter (Signed)
Pt states Dr.Evans did not call in her medication to the [facility] on [address].

## 2018-01-15 NOTE — Progress Notes (Signed)
   Subjective: 67 year old female presenting today as a new patient with a chief complaint of pain to the left heel that began one month ago. She reports associated pes planus as well. She states the pain is worse after standing and walking for long periods of time, especially after going to the gym. She has not done anything for treatment. Patient is here for further evaluation and treatment.   Past Medical History:  Diagnosis Date  . Arthritis   . Bulging lumbar disc   . Gastritis   . GERD (gastroesophageal reflux disease)   . Hypertension   . Lumbar stenosis   . Obesity   . Proctitis   . Pulmonary hypertension ([place])   . Ventral hernia, unspecified, without mention of obstruction or gangrene      Objective: Physical Exam General: The patient is alert and oriented x3 in no acute distress.  Dermatology: Skin is warm, dry and supple bilateral lower extremities. Negative for open lesions or macerations bilateral.   Vascular: Dorsalis Pedis and Posterior Tibial pulses palpable bilateral.  Capillary fill time is immediate to all digits.  Neurological: Epicritic and protective threshold intact bilateral.   Musculoskeletal: Tenderness to palpation to the plantar aspect of the left heel along the plantar fascia. All other joints range of motion within normal limits bilateral. Strength 5/5 in all groups bilateral.   Radiographic exam:   Normal osseous mineralization. Joint spaces preserved. No fracture/dislocation/boney destruction. No other soft tissue abnormalities or radiopaque foreign bodies.   Assessment: 1. Plantar fasciitis left foot  Plan of Care:  1. Patient evaluated. Xrays reviewed.   2. Injection of 0.5cc Celestone soluspan injected into the left plantar fascia.  3. Rx for Meloxicam provided to patient.  4. Plantar fascial band(s) dispensed  5. Instructed patient regarding therapies and modalities at home to alleviate symptoms.  6. Return to clinic in 4 weeks.      Edrick Kins, DPM [facility]  Dr. Edrick Kins, DPM    [address], [place] [address]                Office [phone]  Fax [phone]

## 2018-01-16 ENCOUNTER — Other Ambulatory Visit (INDEPENDENT_AMBULATORY_CARE_PROVIDER_SITE_OTHER): Payer: Medicare Other

## 2018-01-16 ENCOUNTER — Other Ambulatory Visit: Payer: Medicare Other

## 2018-01-16 ENCOUNTER — Ambulatory Visit (INDEPENDENT_AMBULATORY_CARE_PROVIDER_SITE_OTHER): Payer: Medicare Other | Admitting: Physician Assistant

## 2018-01-16 ENCOUNTER — Encounter: Payer: Self-pay | Admitting: Physician Assistant

## 2018-01-16 ENCOUNTER — Other Ambulatory Visit: Payer: Self-pay

## 2018-01-16 VITALS — BP 106/64 | HR 64 | Ht 71.0 in | Wt 325.2 lb

## 2018-01-16 DIAGNOSIS — K219 Gastro-esophageal reflux disease without esophagitis: Secondary | ICD-10-CM

## 2018-01-16 DIAGNOSIS — R079 Chest pain, unspecified: Secondary | ICD-10-CM | POA: Diagnosis not present

## 2018-01-16 DIAGNOSIS — K209 Esophagitis, unspecified without bleeding: Secondary | ICD-10-CM

## 2018-01-16 LAB — CBC WITH DIFFERENTIAL/PLATELET
Basophils Absolute: 0 10*3/uL (ref 0.0–0.1)
Basophils Relative: 0.7 % (ref 0.0–3.0)
EOS PCT: 1.3 % (ref 0.0–5.0)
Eosinophils Absolute: 0.1 10*3/uL (ref 0.0–0.7)
HCT: 45 % (ref 36.0–46.0)
HEMOGLOBIN: 15.2 g/dL — AB (ref 12.0–15.0)
LYMPHS PCT: 31.9 % (ref 12.0–46.0)
Lymphs Abs: 1.8 10*3/uL (ref 0.7–4.0)
MCHC: 33.9 g/dL (ref 30.0–36.0)
MCV: 88.1 fl (ref 78.0–100.0)
MONOS PCT: 6.8 % (ref 3.0–12.0)
Monocytes Absolute: 0.4 10*3/uL (ref 0.1–1.0)
Neutro Abs: 3.4 10*3/uL (ref 1.4–7.7)
Neutrophils Relative %: 59.3 % (ref 43.0–77.0)
Platelets: 254 10*3/uL (ref 150.0–400.0)
RBC: 5.1 Mil/uL (ref 3.87–5.11)
RDW: 13.6 % (ref 11.5–15.5)
WBC: 5.7 10*3/uL (ref 4.0–10.5)

## 2018-01-16 LAB — COMPREHENSIVE METABOLIC PANEL
ALBUMIN: 3.9 g/dL (ref 3.5–5.2)
ALK PHOS: 54 U/L (ref 39–117)
ALT: 22 U/L (ref 0–35)
AST: 13 U/L (ref 0–37)
BUN: 24 mg/dL — ABNORMAL HIGH (ref 6–23)
CO2: 30 mEq/L (ref 19–32)
Calcium: 9.1 mg/dL (ref 8.4–10.5)
Chloride: 105 mEq/L (ref 96–112)
Creatinine, Ser: 1.24 mg/dL — ABNORMAL HIGH (ref 0.40–1.20)
GFR: 55.81 mL/min — AB (ref >60.00)
GLUCOSE: 91 mg/dL (ref 70–99)
POTASSIUM: 3.9 meq/L (ref 3.5–5.1)
Sodium: 142 mEq/L (ref 135–145)
TOTAL PROTEIN: 7.1 g/dL (ref 6.0–8.3)
Total Bilirubin: 0.5 mg/dL (ref 0.2–1.2)

## 2018-01-16 LAB — LIPASE

## 2018-01-16 MED ORDER — DEXLANSOPRAZOLE 60 MG PO CPDR: DELAYED_RELEASE_CAPSULE | ORAL | 3 refills | Status: DC

## 2018-01-16 MED ORDER — SUCRALFATE 1 GM/10ML PO SUSP: 1.0000 g | Freq: Three times a day (TID) | ORAL | 1 refills | Status: DC

## 2018-01-16 NOTE — Patient Instructions (Addendum)
Your provider has requested that you go to the basement level for lab work before leaving today. Press "B" on the elevator. The lab is located at the first door on the left as you exit the elevator.  We sent prescriptions to [facility] [address].  1. Carafate suspension. 2. Dexilant 60 mg.  We have provided you with  Anti-reflux information. Call  us in 2 weeks, [phone]- choose option 2. Ask for Amy's nurse. If your not better we will schedule an Endoscopy. If you are age 65 or older, your body mass index should be between 23-30. Your Body mass index is 45.36 kg/m. If this is out of the aforementioned range listed, please consider follow up with your Primary Care Provider.

## 2018-01-16 NOTE — Progress Notes (Signed)
Subjective:    Patient ID: Kiara Werner, female    DOB: Feb 17, 1951, 67 y.o.   MRN: [id number]  HPI Kiara Werner is a pleasant 67 year old African-American female, known to Dr. Silverio Decamp.  She was last seen in our office in 2017.  She has history of chronic GERD, Ventral Hernia, Asthma Hypertension and Obesity.  She underwent upper abdominal ultrasound in 2015 which was unremarkable and also had an upper GI in 2015 which was normal. Last EGD in March 2014 showed distal esophagitis consistent with reflux esophagitis and an irregular Z line there was no stricture she also had gastritis.  Biopsy showed no evidence of Barrett's and gastric biopsy showed inactive gastritis and no H. pylori. Last colonoscopy in 2012 was negative. Patient had been maintained on Nexium 40 mg p.o. every morning which had controlled her reflux symptoms fairly well.  He says over the past month she has had an exacerbation with burning in her chest radiating through to her back which is been present early consistently.  She has have episodes that have awakened her from sleep.  Symptoms are not necessarily exacerbated by p.o. intake.  Symptoms are not exertional but rather lasting multiple hours per day.  She has not had any associated dysphagia or odynophagia no nausea or vomiting.  Appetite has been fine She denies any abdominal pain.  She has been eating Tums which will ease her symptoms but not eliminate them.  She did increase her Nexium to twice daily a few weeks ago and has not noticed much difference. She has not been on any new medications, no recent antibiotics and denies any regular NSAID use. She generally follows an antireflux regimen including elevation of the head of the bed.  Review of Systems Pertinent positive and negative review of systems were noted in the above HPI section.  All other review of systems was otherwise negative.  Outpatient Encounter Medications as of 01/16/2018  Medication Sig  . aspirin 81  MG tablet Take 81 mg by mouth daily.  Marland Kitchen atenolol (TENORMIN) 25 MG tablet Take 1 tablet (25 mg total) by mouth daily.  . Cholecalciferol (VITAMIN D-3 PO) Take 1 tablet by mouth daily.  Marland Kitchen esomeprazole (NEXIUM) 40 MG capsule Take 1 capsule (40 mg total) by mouth 2 (two) times daily.  . fexofenadine (ALLEGRA) 180 MG tablet Take 1 tablet (180 mg total) by mouth daily.  Marland Kitchen FLECTOR 1.3 % PTCH apply 1 patch to affected area once daily  . hydrochlorothiazide (HYDRODIURIL) 25 MG tablet Take 1 tablet (25 mg total) by mouth daily.  . Multiple Vitamin (MULTIVITAMIN) tablet Take 1 tablet by mouth daily.  . polyethylene glycol powder (GLYCOLAX/MIRALAX) powder Use 1/2 capful daily  . dexlansoprazole (DEXILANT) 60 MG capsule Take 1 tab every morning daily.  . sucralfate (CARAFATE) 1 GM/10ML suspension Take 10 mLs (1 g total) by mouth 4 (four) times daily -  with meals and at bedtime.   No facility-administered encounter medications on file as of 01/16/2018.    No Known Allergies Patient Active Problem List   Diagnosis Date Noted  . Other malaise and fatigue 01/03/2014  . 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 Gynecomastia, female 03/07/2012  . DYSPEPSIA 05/29/2010  . VENTRAL HERNIA 05/29/2010  . GERD 04/30/2008  . OBESITY NOS 07/17/2007  . Essential hypertension 05/25/2007   Social History   Socioeconomic History  . Marital status: Divorced    Spouse name: Not on file  . Number of children: 2  . Years of education: Not  on file  . Highest education level: Not on file  Occupational History  . 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 Occupation: LEGAL SECRETARY    Employer: [employer]  Social Needs  . Financial resource strain: Not on file  . Food insecurity:    Worry: Not on file    Inability: Not on file  . Transportation needs:    Medical: Not on file    Non-medical: Not on file  Tobacco Use  . Smoking status: Never Smoker  . Smokeless tobacco: Former Systems developer    Types: Snuff  . Tobacco comment: hx of  snuff use until Nov 08, 2012  Substance and Sexual Activity  . Alcohol use: No  . Drug use: No  . Sexual activity: Not on file  Lifestyle  . Physical activity:    Days per week: Not on file    Minutes per session: Not on file  . Stress: Not on file  Relationships  . Social connections:    Talks on phone: Not on file    Gets together: Not on file    Attends religious service: Not on file    Active member of club or organization: Not on file    Attends meetings of clubs or organizations: Not on file    Relationship status: Not on file  . Intimate partner violence:    Fear of current or ex partner: Not on file    Emotionally abused: Not on file    Physically abused: Not on file    Forced sexual activity: Not on file  Other Topics Concern  . Not on file  Social History Narrative   Kiara Werner mother    [name]'s family history includes Breast cancer in her cousin; Colon cancer in her maternal uncle; Colon polyps in her maternal uncle; Diabetes in her maternal aunt and paternal aunt; Heart disease in her mother; Kidney disease in her maternal aunt.      Objective:    Vitals:   01/16/18 0912  BP: 106/64  Pulse: 64    Physical Exam ; well-developed older African-American female in no acute distress, very pleasant blood pressure 106/64 pulse 64, height 5 foot 11, weight 325, BMI 45.3.  HEENT; nontraumatic normocephalic EOMI PERRLA sclera anicteric, Cardiovascular ;regular rate and rhythm with S1-S2 no murmur rub or gallop, Pulmonary ;clear bilaterally, Abdomen; obese, soft nontender nondistended bowel sounds are active there is no palpable mass or hepatosplenomegaly, Rectal ;exam not done, Extremities; no clubbing cyanosis or edema skin warm and dry, Neuro psych; mood and affect appropriate                                                     Assessment & Plan:   #72 67 year old African-American female with chronic GERD and previously documented reflux esophagitis who comes in  with exacerbation of symptoms over the past month with complaints of burning type chest pain radiating through to her back. #2.  Morbid obesity-BMI 45 #3.  History of asthma #4.  Hypertension #5.  Colon cancer surveillance-up-to-date with negative colonoscopy 2012  Plan; strict antireflux regimen-was discussed with the patient today and she was provided with a diet and educational materials. We will switch PPI to Dexilant 60 mg p.o. every morning.  Patient was provided with samples and prescription sent Add Carafate suspension 1 g between meals  and at bedtime x1 month, may refill x1 Patient was asked to call back in 2 to 3 weeks with a progress report.  If she has not had any response to the above regimen she may require further diagnostic work-up with EGD and further imaging.  Zarianna Dicarlo Genia Harold PA-C 01/16/2018   Cc: Dorena Cookey, MD

## 2018-01-18 MED ORDER — MELOXICAM 15 MG PO TABS: 15.0000 mg | ORAL_TABLET | Freq: Every day | ORAL | 3 refills | Status: DC

## 2018-01-18 NOTE — Telephone Encounter (Signed)
Called patient back, informed her that Meloxicam has been sent to her pharmacy and she can pick up in the afternoon.

## 2018-01-19 ENCOUNTER — Telehealth: Payer: Self-pay | Admitting: Physician Assistant

## 2018-01-19 ENCOUNTER — Other Ambulatory Visit: Payer: Self-pay

## 2018-01-19 DIAGNOSIS — R1084 Generalized abdominal pain: Secondary | ICD-10-CM

## 2018-01-19 NOTE — Telephone Encounter (Signed)
Advised of the CT date and time. [place] CT on 01/27/18 arrive at 11:00. Clear liquids only at 9:00 am.

## 2018-01-20 NOTE — Progress Notes (Signed)
Reviewed and agree with documentation and assessment and plan. K. Veena Chandler Swiderski , MD   

## 2018-01-27 ENCOUNTER — Ambulatory Visit (INDEPENDENT_AMBULATORY_CARE_PROVIDER_SITE_OTHER)
Admission: RE | Admit: 2018-01-27 | Discharge: 2018-01-27 | Disposition: A | Discharge status: Home / Self Care | Payer: Medicare Other | Source: Ambulatory Visit | Attending: Physician Assistant | Admitting: Physician Assistant

## 2018-01-27 DIAGNOSIS — R1084 Generalized abdominal pain: Secondary | ICD-10-CM | POA: Diagnosis not present

## 2018-01-27 DIAGNOSIS — R1013 Epigastric pain: Secondary | ICD-10-CM | POA: Diagnosis not present

## 2018-01-27 MED ORDER — IOPAMIDOL (ISOVUE-300) INJECTION 61%
100.0000 mL | Freq: Once | INTRAVENOUS | Status: AC | PRN
  Administered 2018-01-27: 100 mL via INTRAVENOUS

## 2018-02-08 ENCOUNTER — Ambulatory Visit (INDEPENDENT_AMBULATORY_CARE_PROVIDER_SITE_OTHER): Payer: Medicare Other | Admitting: Podiatry

## 2018-02-08 DIAGNOSIS — M722 Plantar fascial fibromatosis: Secondary | ICD-10-CM

## 2018-02-11 NOTE — Progress Notes (Signed)
   Subjective: 67 year old female presenting today for follow up evaluation of plantar fasciitis of the left foot. She states the pain has improved. Wearing the fascial brace and the injection has helped alleviate the pain. She states the Mobic has not helped. Patient is here for further evaluation and treatment.   Past Medical History:  Diagnosis Date  . Arthritis   . Bulging lumbar disc   . Gastritis   . GERD (gastroesophageal reflux disease)   . Hypertension   . Lumbar stenosis   . Obesity   . Proctitis   . Pulmonary hypertension ([place])   . Ventral hernia, unspecified, without mention of obstruction or gangrene      Objective: Physical Exam General: The patient is alert and oriented x3 in no acute distress.  Dermatology: Skin is warm, dry and supple bilateral lower extremities. Negative for open lesions or macerations bilateral.   Vascular: Dorsalis Pedis and Posterior Tibial pulses palpable bilateral.  Capillary fill time is immediate to all digits.  Neurological: Epicritic and protective threshold intact bilateral.   Musculoskeletal: Tenderness to palpation to the plantar aspect of the left heel along the plantar fascia. All other joints range of motion within normal limits bilateral. Strength 5/5 in all groups bilateral.     Assessment: 1. Plantar fasciitis left foot - improved  Plan of Care:  1. Patient evaluated.   2. Injection of 0.5cc Celestone soluspan injected into the left plantar fascia.  3. Continue taking Mobic daily.  4. Continue wearing fascial brace and New Balance shoes.  5. Stressed importance of calf stretching.  6. Return to clinic in 4 weeks.   Recently retired.    Edrick Kins, DPM [facility]  Dr. Edrick Kins, DPM    [address], [place] [address]                Office [phone]  Fax [phone]

## 2018-03-08 ENCOUNTER — Encounter: Payer: Medicare Other | Admitting: Podiatry

## 2018-03-19 NOTE — Progress Notes (Signed)
This encounter was created in error - please disregard.

## 2018-09-27 ENCOUNTER — Ambulatory Visit (INDEPENDENT_AMBULATORY_CARE_PROVIDER_SITE_OTHER): Payer: Medicare Other | Admitting: Physician Assistant

## 2018-09-27 ENCOUNTER — Encounter: Payer: Self-pay | Admitting: Physician Assistant

## 2018-09-27 ENCOUNTER — Encounter (INDEPENDENT_AMBULATORY_CARE_PROVIDER_SITE_OTHER): Payer: Self-pay

## 2018-09-27 VITALS — BP 120/80 | HR 82 | Ht 74.0 in | Wt 322.0 lb

## 2018-09-27 DIAGNOSIS — K2100 Gastro-esophageal reflux disease with esophagitis, without bleeding: Secondary | ICD-10-CM

## 2018-09-27 DIAGNOSIS — R12 Heartburn: Secondary | ICD-10-CM | POA: Diagnosis not present

## 2018-09-27 DIAGNOSIS — K209 Esophagitis, unspecified without bleeding: Secondary | ICD-10-CM

## 2018-09-27 MED ORDER — SUCRALFATE 1 GM/10ML PO SUSP: 1.0000 g | Freq: Three times a day (TID) | ORAL | 2 refills | Status: DC

## 2018-09-27 MED ORDER — ESOMEPRAZOLE MAGNESIUM 40 MG PO CPDR: 40.0000 mg | DELAYED_RELEASE_CAPSULE | Freq: Two times a day (BID) | ORAL | 11 refills | Status: AC

## 2018-09-27 NOTE — Progress Notes (Signed)
Subjective:    Patient ID: Kiara Werner, female    DOB: 02/21/1951, 68 y.o.   MRN: [id number]  HPI Kiara Werner is a pleasant 68 year old African-American female, known to Dr. Silverio Decamp and myself.  She was last seen in the office in April 2019.  She has history of chronic GERD and previously documented esophagitis at the time of last EGD in April 2014.  She has not had any evidence of Barrett's. Has history of a ventral hernia, asthma, hypertension and obesity. Patient comes in today stating that she has had a recurrence of burning in her chest and esophagus and into her back over the past several weeks.  When she was last seen in April with very similar symptoms had initially wanted to switch her to Dexilant 60 mg p.o. daily however she could not get this covered.  Nexium was then increased to 40 mg p.o. twice daily x2 to 3 months and she was given a course of Carafate liquid.  She says both of these medications helped and her symptoms settle down and she was doing well on Nexium 40 mg once daily till several weeks ago.  She is not aware of any particular exacerbating factors, no new medications aspirin NSAIDs antibiotics etc.  Her current symptoms feel very similar, and are generally mostly present during the daytime and are not waking her at night.  She denies any dysphagia or odynophagia.  No nausea or vomiting, no abdominal pain.  No associated shortness of breath weakness etc. Last colonoscopy was done in 2012- other than some very mild appearing proctitis.  Biopsy showed benign rectal mucosa.    Review of Systems Pertinent positive and negative review of systems were noted in the above HPI section.  All other review of systems was otherwise negative.  Outpatient Encounter Medications as of 09/27/2018  Medication Sig  . aspirin 81 MG tablet Take 81 mg by mouth daily.  Marland Kitchen atenolol (TENORMIN) 25 MG tablet Take 1 tablet (25 mg total) by mouth daily.  . Cholecalciferol (VITAMIN D-3 PO) Take 1  tablet by mouth daily.  Marland Kitchen esomeprazole (NEXIUM) 40 MG capsule Take 1 capsule (40 mg total) by mouth 2 (two) times daily before a meal.  . fexofenadine (ALLEGRA ALLERGY) 180 MG tablet Take 180 mg by mouth daily as needed for allergies or rhinitis.  Marland Kitchen FLECTOR 1.3 % PTCH apply 1 patch to affected area once daily  . hydrochlorothiazide (HYDRODIURIL) 25 MG tablet Take 1 tablet (25 mg total) by mouth daily.  . Multiple Vitamin (MULTIVITAMIN) tablet Take 1 tablet by mouth daily.  . polyethylene glycol powder (GLYCOLAX/MIRALAX) powder Use 1/2 capful daily  . [DISCONTINUED] esomeprazole (NEXIUM) 40 MG capsule Take 40 mg by mouth daily before breakfast.  . sucralfate (CARAFATE) 1 GM/10ML suspension Take 10 mLs (1 g total) by mouth 4 (four) times daily -  with meals and at bedtime.  . [DISCONTINUED] dexlansoprazole (DEXILANT) 60 MG capsule Take 1 tab every morning daily.  . [DISCONTINUED] esomeprazole (NEXIUM) 40 MG capsule Take 1 capsule (40 mg total) by mouth 2 (two) times daily. (Patient taking differently: Take 40 mg by mouth daily before breakfast. )  . [DISCONTINUED] fexofenadine (ALLEGRA) 180 MG tablet Take 1 tablet (180 mg total) by mouth daily. (Patient taking differently: Take 180 mg by mouth daily as needed. )  . [DISCONTINUED] meloxicam (MOBIC) 15 MG tablet Take 1 tablet (15 mg total) by mouth daily.  . [DISCONTINUED] sucralfate (CARAFATE) 1 GM/10ML suspension Take 10 mLs (1 g  total) by mouth 4 (four) times daily -  with meals and at bedtime.   No facility-administered encounter medications on file as of 09/27/2018.    No Known Allergies Patient Active Problem List   Diagnosis Date Noted  . Other malaise and fatigue 01/03/2014  . 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 Gynecomastia, female 03/07/2012  . DYSPEPSIA 05/29/2010  . VENTRAL HERNIA 05/29/2010  . GERD 04/30/2008  . OBESITY NOS 07/17/2007  . Essential hypertension 05/25/2007   Social History   Socioeconomic History  . Marital status:  Divorced    Spouse name: Not on file  . Number of children: 2  . Years of education: Not on file  . Highest education level: Not on file  Occupational History  . 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 Occupation: LEGAL SECRETARY-retired    Employer: [employer]  Social Needs  . Financial resource strain: Not on file  . Food insecurity:    Worry: Not on file    Inability: Not on file  . Transportation needs:    Medical: Not on file    Non-medical: Not on file  Tobacco Use  . Smoking status: Never Smoker  . Smokeless tobacco: Former Systems developer    Types: Snuff  . Tobacco comment: hx of snuff use until Nov 08, 2012  Substance and Sexual Activity  . Alcohol use: No  . Drug use: No  . Sexual activity: Not on file  Lifestyle  . Physical activity:    Days per week: Not on file    Minutes per session: Not on file  . Stress: Not on file  Relationships  . Social connections:    Talks on phone: Not on file    Gets together: Not on file    Attends religious service: Not on file    Active member of club or organization: Not on file    Attends meetings of clubs or organizations: Not on file    Relationship status: Not on file  . Intimate partner violence:    Fear of current or ex partner: Not on file    Emotionally abused: Not on file    Physically abused: Not on file    Forced sexual activity: Not on file  Other Topics Concern  . Not on file  Social History Narrative   Royce Macadamia mother    Ms. Matarese's family history includes Breast cancer in her cousin; Colon cancer in her maternal uncle; Colon polyps in her maternal uncle; Diabetes in her maternal aunt and paternal aunt; Heart disease in her mother; Kidney disease in her maternal aunt.      Objective:    Vitals:   09/27/18 1034  BP: 120/80  Pulse: 82    Physical Exam; well-developed older African-American female in no acute distress, pleasant blood pressure 120/80 pulse 82, height 6 foot 2, weight 322, BMI 41.3.  HEENT;  nontraumatic normocephalic EOMI PERRLA sclera anicteric oropharynx benign, mucosa moist.  Cardiovascular ;regular rate and rhythm with S1-S2 no murmur rub or gallop, Pulmonary; clear bilaterally, Abdomen ;obese, soft, nontender nondistended bowel sounds are active there is no palpable mass or hepatosplenomegaly, Rectal; exam not done, Extremities; no clubbing cyanosis or edema skin warm and dry, Neuropsych ;alert and oriented, grossly nonfocal mood and affect appropriate       Assessment & Plan:   #72 68 year old African-American female with chronic GERD and previously documented reflux esophagitis who comes in with several week history of recurrent burning in her chest and into her back very  consistent with exacerbation of it and probable esophagitis.  This is despite staying on Nexium 40 mg p.o. every morning.  #2 history of ventral hernia 3.  Asthma 4.  Hypertension 5.  Obesity 6.  Colon cancer screening-up-to-date with negative colonoscopy 2012 will be due 2022  Plan; we reviewed the importance of a strict antireflux regimen, n.p.o. for 2 to 3 hours prior to bedtime, and she is advised to elevate her back about 40 degrees while sleeping.  We discussed use of a wedge pillow which she says she will purchase.  Will increase Nexium to 40 mg p.o. twice daily AC breakfast and AC dinner.  She was given a prescription to take to the [place] she can refill x1 year. Start Carafate liquid 1 g p.o. between meals and at bedtime x1 month, 2 refills Patient will follow-up in the office with me on an as-needed basis and is aware she will need follow-up colonoscopy in 2022.  Neetu Carrozza S Fahd Galea PA-C 09/27/2018   Cc: Dorena Cookey, MD

## 2018-09-27 NOTE — Patient Instructions (Signed)
We sent refills of Carafate Liquid to [facility], [address], [place], [place]. We have given you a printed prescription for the Nexium ( esomeprazole) 40 mg.   Elevate the head of the bed at 45 degrees.  Get a wedge pillow/antireflux pillow.  We have given you anti-reflux information. Normal BMI (Body Mass Index- based on height and weight) is between 23 and 30. Your BMI today is Body mass index is 41.34 kg/m. Marland Kitchen Please consider follow up  regarding your BMI with your Primary Care Provider.

## 2018-11-07 ENCOUNTER — Ambulatory Visit
Admission: EM | Admit: 2018-11-07 | Discharge: 2018-11-07 | Disposition: A | Discharge status: Home / Self Care | Payer: Medicare Other | Attending: Family Medicine | Admitting: Family Medicine

## 2018-11-07 DIAGNOSIS — J320 Chronic maxillary sinusitis: Secondary | ICD-10-CM

## 2018-11-07 MED ORDER — FLUTICASONE PROPIONATE 50 MCG/ACT NA SUSP: 2.0000 | Freq: Every day | NASAL | 12 refills | Status: AC

## 2018-11-07 MED ORDER — AMOXICILLIN 875 MG PO TABS: 875.0000 mg | ORAL_TABLET | Freq: Two times a day (BID) | ORAL | 0 refills | Status: DC

## 2018-11-07 NOTE — ED Triage Notes (Signed)
Pt c/o nasal congestion for over 3wks, taking OTC meds with no relief

## 2018-11-07 NOTE — ED Provider Notes (Signed)
[facility]    CSN: [id number] Arrival date & time: 11/07/18  1200     History   Chief Complaint Chief Complaint  Patient presents with  . Nasal Congestion    HPI Kiara Werner is a 68 y.o. female.   Pt c/o nasal congestion for over 3wks, taking OTC meds with no relief  Has been taking Flonase.  Also taking Afrin every day.  No cough or fever.  Has frontal headache     Past Medical History:  Diagnosis Date  . Arthritis   . Bulging lumbar disc   . Gastritis   . GERD (gastroesophageal reflux disease)   . Hypertension   . Lumbar stenosis   . Obesity   . Proctitis   . Pulmonary hypertension ([place])   . Ventral hernia, unspecified, without mention of obstruction or gangrene     Patient Active Problem List   Diagnosis Date Noted  . Other malaise and fatigue 01/03/2014  . 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 Gynecomastia, female 03/07/2012  . DYSPEPSIA 05/29/2010  . VENTRAL HERNIA 05/29/2010  . GERD 04/30/2008  . OBESITY NOS 07/17/2007  . Essential hypertension 05/25/2007    Past Surgical History:  Procedure Laterality Date  . ABDOMINAL HYSTERECTOMY    . CARPAL TUNNEL RELEASE     right  . TUBAL LIGATION    . WRIST SURGERY     ganglion cyst right    OB History   No obstetric history on file.      Home Medications    Prior to Admission medications   Medication Sig Start Date End Date Taking? Authorizing Provider  amoxicillin (AMOXIL) 875 MG tablet Take 1 tablet (875 mg total) by mouth 2 (two) times daily. 11/07/18   Robyn Haber, MD  aspirin 81 MG tablet Take 81 mg by mouth daily.    [provider]  atenolol (TENORMIN) 25 MG tablet Take 1 tablet (25 mg total) by mouth daily. 02/20/16   Nafziger, Tommi Rumps, NP  Cholecalciferol (VITAMIN D-3 PO) Take 1 tablet by mouth daily.    [provider]  esomeprazole (NEXIUM) 40 MG capsule Take 1 capsule (40 mg total) by mouth 2 (two) times daily before a meal. 09/27/18   Esterwood, Amy  S, PA-C  fexofenadine (ALLEGRA ALLERGY) 180 MG tablet Take 180 mg by mouth daily as needed for allergies or rhinitis.    [provider]  FLECTOR 1.3 % PTCH apply 1 patch to affected area once daily 09/16/15   [provider]  fluticasone (FLONASE) 50 MCG/ACT nasal spray Place 2 sprays into both nostrils daily. 11/07/18   Robyn Haber, MD  hydrochlorothiazide (HYDRODIURIL) 25 MG tablet Take 1 tablet (25 mg total) by mouth daily. 02/20/16   Nafziger, Tommi Rumps, NP  Multiple Vitamin (MULTIVITAMIN) tablet Take 1 tablet by mouth daily.    [provider]  polyethylene glycol powder (GLYCOLAX/MIRALAX) powder Use 1/2 capful daily 10/22/15   Nandigam, Venia Minks, MD  sucralfate (CARAFATE) 1 GM/10ML suspension Take 10 mLs (1 g total) by mouth 4 (four) times daily -  with meals and at bedtime. 09/27/18   Esterwood, Amy S, PA-C    Family History Family History  Problem Relation Age of Onset  . Heart disease Mother   . Breast cancer Cousin   . Colon cancer Maternal Uncle   . Colon polyps Maternal Uncle   . Diabetes Maternal Aunt   . Kidney disease Maternal Aunt   . Diabetes Paternal Aunt     Social  History Social History   Tobacco Use  . Smoking status: Never Smoker  . Smokeless tobacco: Former Systems developer    Types: Snuff  . Tobacco comment: hx of snuff use until Nov 08, 2012  Substance Use Topics  . Alcohol use: No  . Drug use: No     Allergies   Patient has no known allergies.   Review of Systems Review of Systems   Physical Exam Triage Vital Signs ED Triage Vitals  Enc Vitals Group     BP 11/07/18 1218 (!) 162/82     Pulse Rate 11/07/18 1218 64     Resp 11/07/18 1218 18     Temp 11/07/18 1218 97.6 F (36.4 C)     Temp Source 11/07/18 1218 Oral     SpO2 11/07/18 1218 95 %     Weight --      Height --      Head Circumference --      Peak Flow --      Pain Score 11/07/18 1219 0     Pain Loc --      Pain Edu? --      Excl. in [place]? --    No data  found.  Updated Vital Signs BP (!) 162/82 (BP Location: Left Arm)   Pulse 64   Temp 97.6 F (36.4 C) (Oral)   Resp 18   SpO2 95%    Physical Exam Vitals signs and nursing note reviewed.  Constitutional:      Appearance: Normal appearance. She is obese.  HENT:     Head: Normocephalic.     Right Ear: Tympanic membrane and external ear normal.     Left Ear: Tympanic membrane and external ear normal.     Nose: Congestion present.     Mouth/Throat:     Mouth: Mucous membranes are moist.     Pharynx: Oropharynx is clear.  Eyes:     Conjunctiva/sclera: Conjunctivae normal.  Neck:     Musculoskeletal: Normal range of motion and neck supple.  Cardiovascular:     Rate and Rhythm: Normal rate and regular rhythm.     Pulses: Normal pulses.     Heart sounds: Normal heart sounds.  Pulmonary:     Effort: Pulmonary effort is normal.     Breath sounds: Normal breath sounds.  Musculoskeletal: Normal range of motion.  Skin:    General: Skin is warm.  Neurological:     General: No focal deficit present.     Mental Status: She is alert.  Psychiatric:        Mood and Affect: Mood normal.      UC Treatments / Results  Labs (all labs ordered are listed, but only abnormal results are displayed) Labs Reviewed - No data to display  EKG None  Radiology No results found.  Procedures Procedures (including critical care time)  Medications Ordered in UC Medications - No data to display  Initial Impression / Assessment and Plan / UC Course  I have reviewed the triage vital signs and the nursing notes.  Pertinent labs & imaging results that were available during my care of the patient were reviewed by me and considered in my medical decision making (see chart for details).    Final Clinical Impressions(s) / UC Diagnoses   Final diagnoses:  Chronic maxillary sinusitis   Discharge Instructions   None    ED Prescriptions    Medication Sig Dispense Auth. Provider    amoxicillin (AMOXIL) 875 MG tablet Take  1 tablet (875 mg total) by mouth 2 (two) times daily. 20 tablet Robyn Haber, MD   fluticasone [facility]) 50 MCG/ACT nasal spray Place 2 sprays into both nostrils daily. 16 g Robyn Haber, MD     Controlled Substance Prescriptions [place] Controlled Substance Registry consulted? Not Applicable   Robyn Haber, MD 11/07/18 1245

## 2019-04-19 ENCOUNTER — Other Ambulatory Visit: Payer: Self-pay

## 2019-07-18 ENCOUNTER — Other Ambulatory Visit: Payer: Self-pay | Admitting: Adult Health

## 2019-07-18 DIAGNOSIS — Z1231 Encounter for screening mammogram for malignant neoplasm of breast: Secondary | ICD-10-CM

## 2019-09-10 ENCOUNTER — Ambulatory Visit
Admission: RE | Admit: 2019-09-10 | Discharge: 2019-09-10 | Disposition: A | Discharge status: Home / Self Care | Payer: Medicare Other | Source: Ambulatory Visit | Attending: Adult Health | Admitting: Adult Health

## 2019-09-10 ENCOUNTER — Other Ambulatory Visit: Payer: Self-pay | Admitting: Family Medicine

## 2019-09-10 ENCOUNTER — Other Ambulatory Visit: Payer: Self-pay

## 2019-09-10 ENCOUNTER — Ambulatory Visit
Admission: EM | Admit: 2019-09-10 | Discharge: 2019-09-10 | Disposition: A | Discharge status: Home / Self Care | Payer: Medicare Other | Attending: Physician Assistant | Admitting: Physician Assistant

## 2019-09-10 DIAGNOSIS — R0981 Nasal congestion: Secondary | ICD-10-CM

## 2019-09-10 DIAGNOSIS — Z1231 Encounter for screening mammogram for malignant neoplasm of breast: Secondary | ICD-10-CM

## 2019-09-10 DIAGNOSIS — J324 Chronic pansinusitis: Secondary | ICD-10-CM | POA: Diagnosis not present

## 2019-09-10 DIAGNOSIS — Z20828 Contact with and (suspected) exposure to other viral communicable diseases: Secondary | ICD-10-CM | POA: Diagnosis not present

## 2019-09-10 MED ORDER — DOXYCYCLINE HYCLATE 100 MG PO CAPS: 100.0000 mg | ORAL_CAPSULE | Freq: Two times a day (BID) | ORAL | 0 refills | Status: AC

## 2019-09-10 MED ORDER — AZELASTINE HCL 0.1 % NA SOLN: 2.0000 | Freq: Two times a day (BID) | NASAL | 0 refills | Status: AC

## 2019-09-10 NOTE — Discharge Instructions (Signed)
COVID PCR testing ordered. I would like you to quarantine until testing results. Start doxycycline as directed. Add azelastine as directed.  If experiencing shortness of breath, trouble breathing, go to the emergency department for further evaluation needed. If symptoms not improving, or returns, follow up with ENT for further evaluation needed.

## 2019-09-10 NOTE — ED Provider Notes (Signed)
Kiara Werner    CSN: [id number] Arrival date & time: 09/10/19  1212      History   Chief Complaint Chief Complaint  Patient presents with  . Facial Pain    HPI [name] is a 68 y.o. female.   68 year old female comes in for few month history of sinus pressure, nasal congestion, left eye drainage.  She was last seen 10/2018 for chronic sinusitis.  At that time, she was given amoxicillin.  States this improved symptoms, but symptoms returned after taking medications.  Since then, she has been using Flonase and Mucinex every day without much relief.  She denies any fever, chills, body aches.  Denies shortness of breath, loss of taste or smell.  Denies abdominal pain, nausea, vomiting, diarrhea.     Past Medical History:  Diagnosis Date  . Arthritis   . Bulging lumbar disc   . Gastritis   . GERD (gastroesophageal reflux disease)   . Hypertension   . Lumbar stenosis   . Obesity   . Proctitis   . Pulmonary hypertension ([place])   . Ventral hernia, unspecified, without mention of obstruction or gangrene     Patient Active Problem List   Diagnosis Date Noted  . Other malaise and fatigue 01/03/2014  . 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 Gynecomastia, female 03/07/2012  . DYSPEPSIA 05/29/2010  . VENTRAL HERNIA 05/29/2010  . GERD 04/30/2008  . OBESITY NOS 07/17/2007  . Essential hypertension 05/25/2007    Past Surgical History:  Procedure Laterality Date  . ABDOMINAL HYSTERECTOMY    . CARPAL TUNNEL RELEASE     right  . TUBAL LIGATION    . WRIST SURGERY     ganglion cyst right    OB History   No obstetric history on file.      Home Medications    Prior to Admission medications   Medication Sig Start Date End Date Taking? Authorizing Provider  aspirin 81 MG tablet Take 81 mg by mouth daily.    [provider]  atenolol (TENORMIN) 25 MG tablet Take 1 tablet (25 mg total) by mouth daily. 02/20/16   Nafziger, Tommi Rumps, NP  azelastine  (ASTELIN) 0.1 % nasal spray Place 2 sprays into both nostrils 2 (two) times daily. 09/10/19   Tasia Catchings, Emalie Mcwethy V, PA-C  Cholecalciferol (VITAMIN D-3 PO) Take 1 tablet by mouth daily.    [provider]  doxycycline (VIBRAMYCIN) 100 MG capsule Take 1 capsule (100 mg total) by mouth 2 (two) times daily. 09/10/19   Ok Edwards, PA-C  esomeprazole (NEXIUM) 40 MG capsule Take 1 capsule (40 mg total) by mouth 2 (two) times daily before a meal. 09/27/18   Esterwood, Jeshawn Melucci S, PA-C  FLECTOR 1.3 % PTCH apply 1 patch to affected area once daily 09/16/15   [provider]  fluticasone (FLONASE) 50 MCG/ACT nasal spray Place 2 sprays into both nostrils daily. 11/07/18   Robyn Haber, MD  hydrochlorothiazide (HYDRODIURIL) 25 MG tablet Take 1 tablet (25 mg total) by mouth daily. 02/20/16   Nafziger, Tommi Rumps, NP  Multiple Vitamin (MULTIVITAMIN) tablet Take 1 tablet by mouth daily.    [provider]    Family History Family History  Problem Relation Age of Onset  . Heart disease Mother   . Breast cancer Cousin   . Colon cancer Maternal Uncle   . Colon polyps Maternal Uncle   . Diabetes Maternal Aunt   . Kidney disease Maternal Aunt   . Diabetes Paternal Aunt  Social History Social History   Tobacco Use  . Smoking status: Never Smoker  . Smokeless tobacco: Former Systems developer    Types: Snuff  . Tobacco comment: hx of snuff use until Nov 08, 2012  Substance Use Topics  . Alcohol use: No  . Drug use: No     Allergies   Patient has no known allergies.   Review of Systems Review of Systems  Reason unable to perform ROS: See HPI as above.     Physical Exam Triage Vital Signs ED Triage Vitals  Enc Vitals Group     BP 09/10/19 1317 (!) 176/91     Pulse Rate 09/10/19 1317 69     Resp 09/10/19 1317 18     Temp 09/10/19 1317 98.6 F (37 C)     Temp Source 09/10/19 1317 Oral     SpO2 09/10/19 1317 96 %     Weight --      Height --      Head Circumference --      Peak Flow --       Pain Score 09/10/19 1318 0     Pain Loc --      Pain Edu? --      Excl. in [place]? --    No data found.  Updated Vital Signs BP (!) 176/91 (BP Location: Left Arm)   Pulse 69   Temp 98.6 F (37 C) (Oral)   Resp 18   SpO2 96%   Physical Exam Constitutional:      General: She is not in acute distress.    Appearance: Normal appearance. She is not ill-appearing, toxic-appearing or diaphoretic.  HENT:     Head: Normocephalic and atraumatic.     Right Ear: Tympanic membrane, ear canal and external ear normal.     Left Ear: Tympanic membrane, ear canal and external ear normal.     Nose:     Right Sinus: Maxillary sinus tenderness and frontal sinus tenderness present.     Left Sinus: Maxillary sinus tenderness and frontal sinus tenderness present.     Mouth/Throat:     Mouth: Mucous membranes are moist.     Pharynx: Oropharynx is clear. Uvula midline.  Cardiovascular:     Rate and Rhythm: Normal rate and regular rhythm.     Heart sounds: Normal heart sounds. No murmur. No friction rub. No gallop.   Pulmonary:     Effort: Pulmonary effort is normal. No accessory muscle usage, prolonged expiration, respiratory distress or retractions.     Comments: Lungs clear to auscultation without adventitious lung sounds. Musculoskeletal:     Cervical back: Normal range of motion and neck supple.  Neurological:     General: No focal deficit present.     Mental Status: She is alert and oriented to person, place, and time.      UC Treatments / Results  Labs (all labs ordered are listed, but only abnormal results are displayed) Labs Reviewed  NOVEL CORONAVIRUS, NAA    EKG   Radiology No results found.  Procedures Procedures (including critical Werner time)  Medications Ordered in UC Medications - No data to display  Initial Impression / Assessment and Plan / UC Course  I have reviewed the triage vital signs and the nursing notes.  Pertinent labs & imaging results that were available  during my Werner of the patient were reviewed by me and considered in my medical decision making (see chart for details).    Covid testing ordered.  Patient to remain in quarantine until testing results return.  Will start doxycycline, azelastine as directed.  If Covid test negative, and symptoms not improving, patient to follow-up with ENT for further evaluation of chronic sinusitis.  Return precautions given.  Patient expresses understanding and agrees to plan. Final Clinical Impressions(s) / UC Diagnoses   Final diagnoses:  Congestion of nasal sinus  Chronic pansinusitis   ED Prescriptions    Medication Sig Dispense Auth. Provider   doxycycline (VIBRAMYCIN) 100 MG capsule Take 1 capsule (100 mg total) by mouth 2 (two) times daily. 14 capsule Ashantee Deupree V, PA-C   azelastine (ASTELIN) 0.1 % nasal spray Place 2 sprays into both nostrils 2 (two) times daily. 30 mL Ok Edwards, PA-C     PDMP not reviewed this encounter.   Ok Edwards, PA-C 09/10/19 1955

## 2019-09-10 NOTE — ED Triage Notes (Signed)
Pt c/o sinus pressure, nasal congestion and lt eye pain and watery for "months".  States taking Flonase and Mussnex everyday with no relief

## 2019-09-11 LAB — NOVEL CORONAVIRUS, NAA: SARS-CoV-2, NAA: NOT DETECTED

## 2019-09-24 DIAGNOSIS — J324 Chronic pansinusitis: Secondary | ICD-10-CM | POA: Diagnosis not present

## 2019-09-24 DIAGNOSIS — R519 Headache, unspecified: Secondary | ICD-10-CM | POA: Diagnosis not present

## 2019-09-24 DIAGNOSIS — J310 Chronic rhinitis: Secondary | ICD-10-CM | POA: Diagnosis not present

## 2019-09-24 DIAGNOSIS — J343 Hypertrophy of nasal turbinates: Secondary | ICD-10-CM | POA: Diagnosis not present

## 2019-10-29 DIAGNOSIS — J342 Deviated nasal septum: Secondary | ICD-10-CM | POA: Diagnosis not present

## 2019-10-29 DIAGNOSIS — J343 Hypertrophy of nasal turbinates: Secondary | ICD-10-CM | POA: Diagnosis not present

## 2019-10-29 DIAGNOSIS — R519 Headache, unspecified: Secondary | ICD-10-CM | POA: Diagnosis not present

## 2019-10-29 DIAGNOSIS — J310 Chronic rhinitis: Secondary | ICD-10-CM | POA: Diagnosis not present

## 2019-11-16 ENCOUNTER — Ambulatory Visit: Payer: Medicare Other | Attending: Internal Medicine

## 2019-11-16 DIAGNOSIS — Z23 Encounter for immunization: Secondary | ICD-10-CM | POA: Insufficient documentation

## 2019-11-16 NOTE — Progress Notes (Signed)
   Covid-19 Vaccination Clinic  Name:  Kiara Werner    MRN: [id number] DOB: 1951-09-10  11/16/2019  [name] was observed post Covid-19 immunization for 15 minutes without incidence. She was provided with Vaccine Information Sheet and instruction to access the V-Safe system.   [name] was instructed to call 911 with any severe reactions post vaccine: Marland Kitchen Difficulty breathing  . Swelling of your face and throat  . A fast heartbeat  . A bad rash all over your body  . Dizziness and weakness    Immunizations Administered    Name Date Dose VIS Date Route   Pfizer COVID-19 Vaccine 11/16/2019  9:34 AM 0.3 mL 08/31/2019 Intramuscular   Manufacturer: [place]   Lot: [id number]   [name]: [id number]

## 2019-12-12 ENCOUNTER — Ambulatory Visit: Payer: Medicare Other | Attending: Internal Medicine

## 2019-12-12 DIAGNOSIS — Z23 Encounter for immunization: Secondary | ICD-10-CM

## 2019-12-12 NOTE — Progress Notes (Signed)
   Covid-19 Vaccination Clinic  Name:  Kiara Werner    MRN: [id number] DOB: [birth year]  12/12/2019  [name] was observed post Covid-19 immunization for 15 minutes without incident. She was provided with Vaccine Information Sheet and instruction to access the V-Safe system.   [name] was instructed to call 911 with any severe reactions post vaccine: Marland Kitchen Difficulty breathing  . Swelling of face and throat  . A fast heartbeat  . A bad rash all over body  . Dizziness and weakness   Immunizations Administered    Name Date Dose VIS Date Route   Pfizer COVID-19 Vaccine 12/12/2019  9:26 AM 0.3 mL 08/31/2019 Intramuscular   Manufacturer: [place]   Lot: [id number]   [place]: [id number]

## 2020-07-19 ENCOUNTER — Other Ambulatory Visit: Payer: Self-pay

## 2020-07-19 ENCOUNTER — Ambulatory Visit: Payer: Medicare Other | Attending: Internal Medicine

## 2020-07-19 DIAGNOSIS — Z23 Encounter for immunization: Secondary | ICD-10-CM

## 2020-07-19 NOTE — Progress Notes (Signed)
   Covid-19 Vaccination Clinic  Name:  Kiara Werner    MRN: [id number] DOB: July 31, 1951  07/19/2020  [name] was observed post Covid-19 immunization for 15 minutes without incident. She was provided with Vaccine Information Sheet and instruction to access the V-Safe system.   [name] was instructed to call 911 with any severe reactions post vaccine: Marland Kitchen Difficulty breathing  . Swelling of face and throat  . A fast heartbeat  . A bad rash all over body  . Dizziness and weakness

## 2020-10-13 ENCOUNTER — Ambulatory Visit: Payer: No Typology Code available for payment source | Admitting: Allergy

## 2020-11-24 ENCOUNTER — Ambulatory Visit: Payer: No Typology Code available for payment source | Admitting: Allergy

## 2020-12-18 ENCOUNTER — Other Ambulatory Visit: Payer: Self-pay | Admitting: Family Medicine

## 2020-12-18 DIAGNOSIS — Z1231 Encounter for screening mammogram for malignant neoplasm of breast: Secondary | ICD-10-CM

## 2021-02-09 ENCOUNTER — Ambulatory Visit: Payer: Medicare Other

## 2021-04-02 ENCOUNTER — Ambulatory Visit: Payer: Medicare Other

## 2021-04-13 ENCOUNTER — Other Ambulatory Visit: Payer: Self-pay

## 2021-04-13 ENCOUNTER — Ambulatory Visit
Admission: RE | Admit: 2021-04-13 | Discharge: 2021-04-13 | Disposition: A | Discharge status: Home / Self Care | Payer: Medicare Other | Source: Ambulatory Visit | Attending: Family Medicine | Admitting: Family Medicine

## 2021-04-13 DIAGNOSIS — Z1231 Encounter for screening mammogram for malignant neoplasm of breast: Secondary | ICD-10-CM

## 2022-04-29 ENCOUNTER — Other Ambulatory Visit: Payer: Self-pay | Admitting: Family Medicine

## 2022-04-29 DIAGNOSIS — Z1231 Encounter for screening mammogram for malignant neoplasm of breast: Secondary | ICD-10-CM

## 2022-05-19 ENCOUNTER — Ambulatory Visit
Admission: RE | Admit: 2022-05-19 | Discharge: 2022-05-19 | Disposition: A | Discharge status: Home / Self Care | Payer: Medicare Other | Source: Ambulatory Visit | Attending: Family Medicine | Admitting: Family Medicine

## 2022-05-19 DIAGNOSIS — Z1231 Encounter for screening mammogram for malignant neoplasm of breast: Secondary | ICD-10-CM | POA: Diagnosis not present

## 2023-03-19 IMAGING — MG MM DIGITAL SCREENING BILAT W/ TOMO AND CAD
8 of 14 series · 8 of 38 positions shown · non-contrast
Comparison: Previous exam(s).

ACR Breast Density Category a: The breast tissue is almost entirely
fatty.

CLINICAL DATA: Screening.

EXAM:
DIGITAL SCREENING BILATERAL MAMMOGRAM WITH TOMOSYNTHESIS AND CAD
TECHNIQUE: Bilateral screening digital craniocaudal and mediolateral oblique
mammograms were obtained. Bilateral screening digital breast
tomosynthesis was performed. The images were evaluated with
computer-aided detection.

[L CV synth-2D]
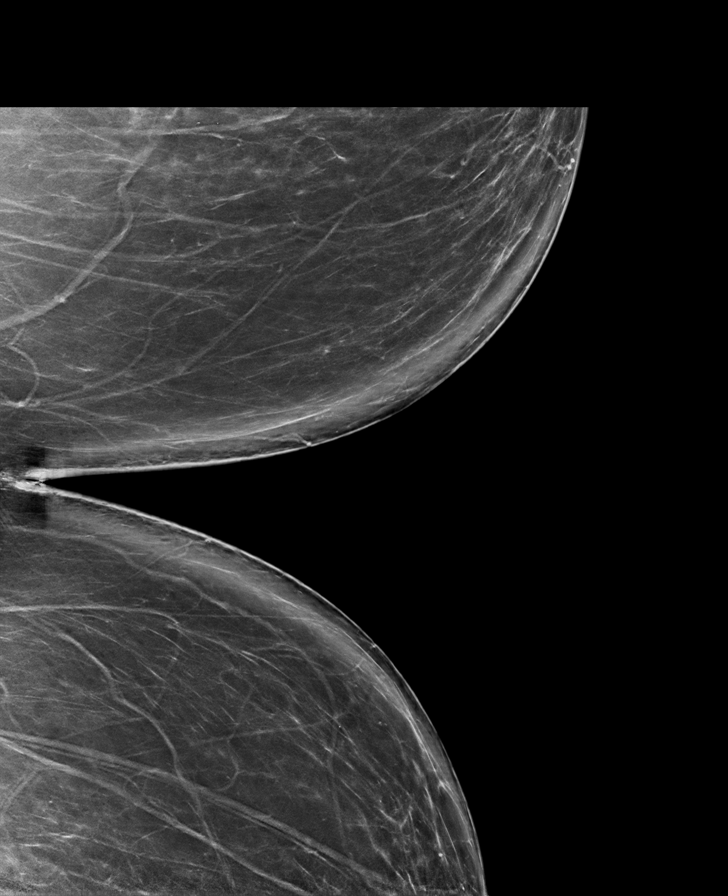

[R MLO synth-2D (1 of 2)]
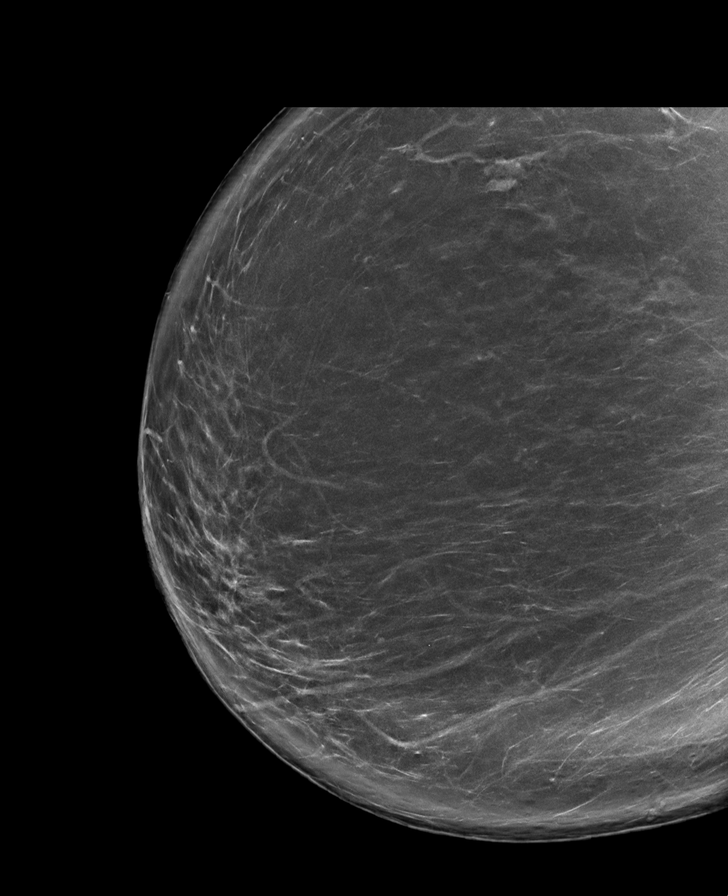

[L MLO synth-2D (1 of 2)]
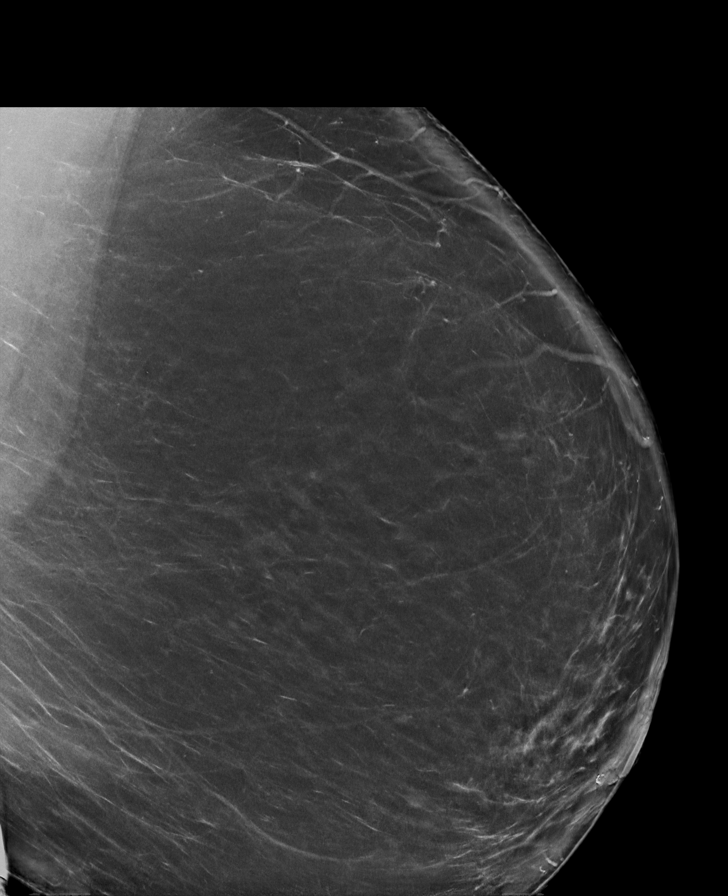

[R MLO synth-2D (2 of 2)]
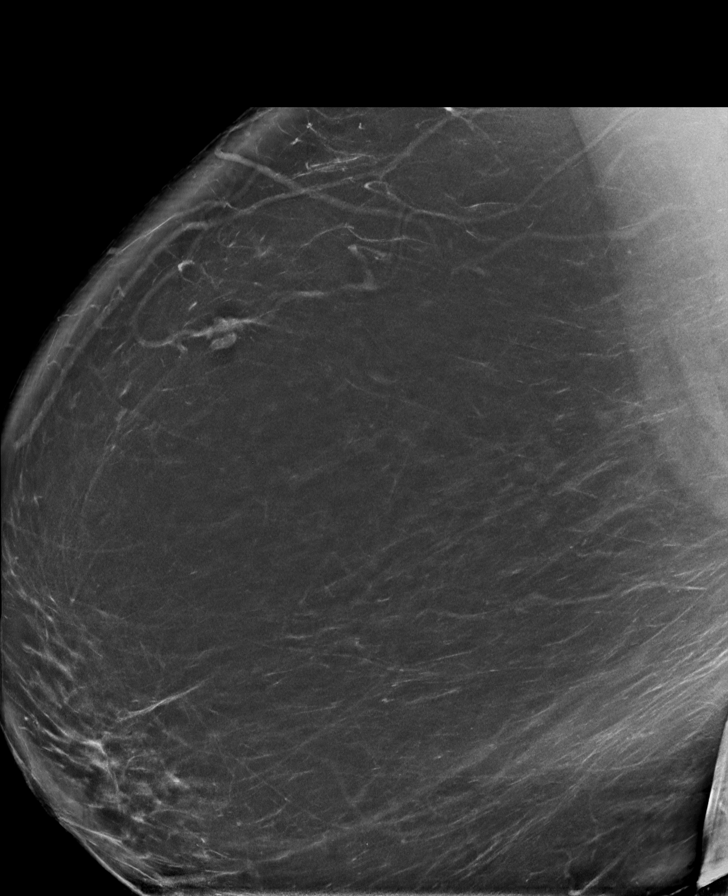

[R CC synth-2D (1 of 2)]
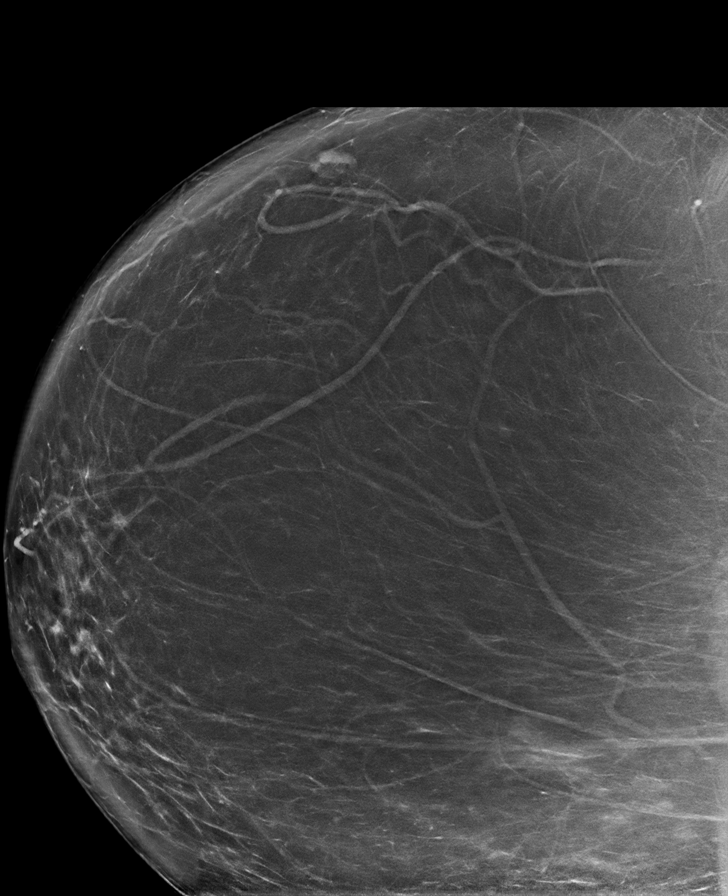

[R CC synth-2D (2 of 2)]
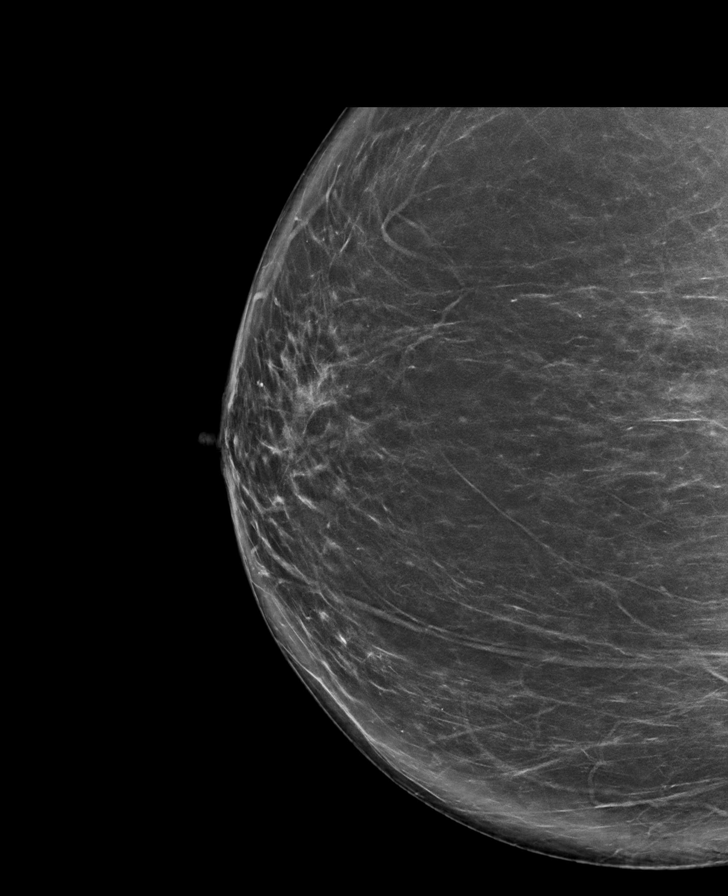

[L CC synth-2D]
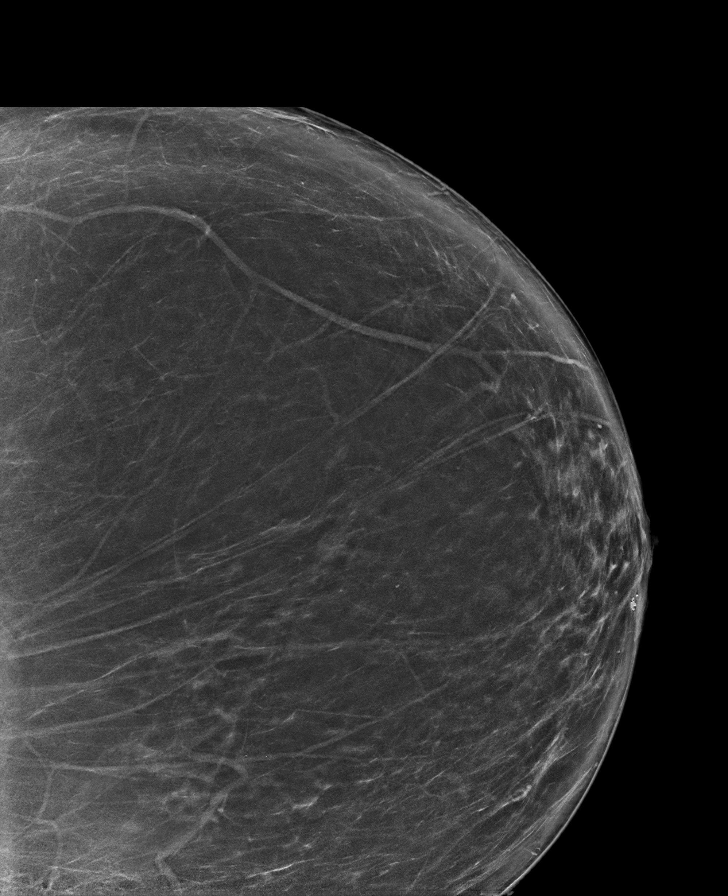

[L MLO synth-2D (2 of 2)]
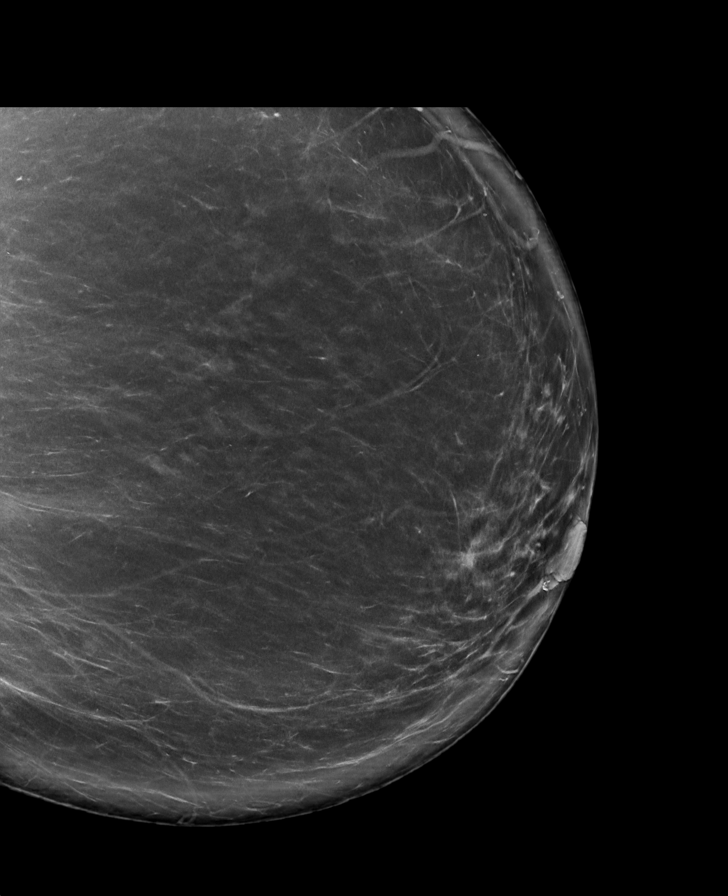

[8 of 38 positions shown; findings below may reference images not displayed]

FINDINGS: There are no findings suspicious for malignancy.
IMPRESSION: No mammographic evidence of malignancy. A result letter of this
screening mammogram will be mailed directly to the patient.

RECOMMENDATION:
Screening mammogram in one year. (Code:0E-3-N98)

BI-RADS CATEGORY  1: Negative.

## 2023-06-30 ENCOUNTER — Other Ambulatory Visit: Payer: Self-pay | Admitting: Family Medicine

## 2023-06-30 DIAGNOSIS — Z1231 Encounter for screening mammogram for malignant neoplasm of breast: Secondary | ICD-10-CM

## 2023-07-20 ENCOUNTER — Ambulatory Visit: Payer: Medicare Other

## 2023-07-27 ENCOUNTER — Ambulatory Visit
Admission: RE | Admit: 2023-07-27 | Discharge: 2023-07-27 | Disposition: A | Discharge status: Home / Self Care | Payer: Medicare Other | Source: Ambulatory Visit | Attending: Family Medicine | Admitting: Family Medicine

## 2023-07-27 DIAGNOSIS — Z1231 Encounter for screening mammogram for malignant neoplasm of breast: Secondary | ICD-10-CM

## 2024-07-20 ENCOUNTER — Other Ambulatory Visit: Payer: Self-pay | Admitting: Family Medicine

## 2024-07-20 DIAGNOSIS — Z1231 Encounter for screening mammogram for malignant neoplasm of breast: Secondary | ICD-10-CM

## 2024-08-14 ENCOUNTER — Ambulatory Visit
Admission: RE | Admit: 2024-08-14 | Discharge: 2024-08-14 | Disposition: A | Discharge status: Home / Self Care | Source: Ambulatory Visit | Attending: Family Medicine | Admitting: Family Medicine

## 2024-08-14 DIAGNOSIS — Z1231 Encounter for screening mammogram for malignant neoplasm of breast: Secondary | ICD-10-CM
